# Patient Record
Sex: Female | Born: 1950 | Race: White | Hispanic: No | Marital: Single | Smoking: Former smoker
Health system: Southern US, Community
[De-identification: ages and names within clinical notes are randomized; demographics above are authoritative.]

## PROBLEM LIST (undated history)

## (undated) DIAGNOSIS — E079 Disorder of thyroid, unspecified: Secondary | ICD-10-CM

## (undated) DIAGNOSIS — F32A Depression, unspecified: Secondary | ICD-10-CM

## (undated) DIAGNOSIS — F329 Major depressive disorder, single episode, unspecified: Secondary | ICD-10-CM

## (undated) HISTORY — PX: BREAST BIOPSY: SHX20

## (undated) HISTORY — DX: Major depressive disorder, single episode, unspecified: F32.9

## (undated) HISTORY — DX: Disorder of thyroid, unspecified: E07.9

## (undated) HISTORY — DX: Depression, unspecified: F32.A

## (undated) HISTORY — PX: ABDOMINAL HYSTERECTOMY: SHX81

---

## 2002-12-26 ENCOUNTER — Encounter: Admission: RE | Admit: 2002-12-26 | Discharge: 2003-01-16 | Payer: Self-pay | Admitting: Internal Medicine

## 2003-12-02 ENCOUNTER — Ambulatory Visit (HOSPITAL_COMMUNITY): Admission: RE | Admit: 2003-12-02 | Discharge: 2003-12-02 | Payer: Self-pay | Admitting: Internal Medicine

## 2003-12-30 ENCOUNTER — Ambulatory Visit (HOSPITAL_COMMUNITY): Admission: RE | Admit: 2003-12-30 | Discharge: 2003-12-30 | Payer: Self-pay | Admitting: Internal Medicine

## 2004-10-19 IMAGING — CT CT HEAD W/O CM
1 of 2 series · 13 of 30 positions shown, 17 images · non-contrast
Comparison: none

[Series 2: brain · axial · 0.47mm/px · z∈[+147,+261]mm · 13 of 32 slices shown, 17 images]
[im 3/32  brain]
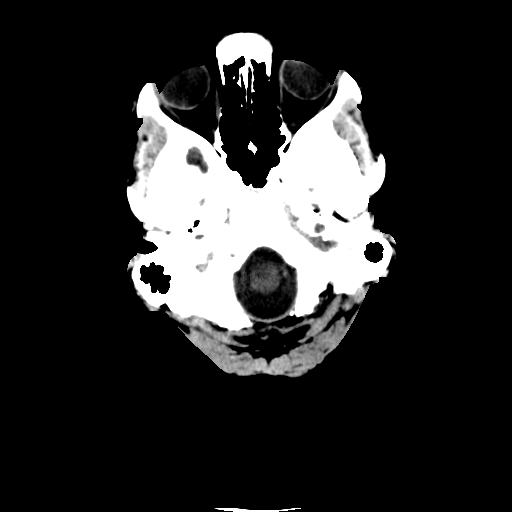
[im 3/32  bone]
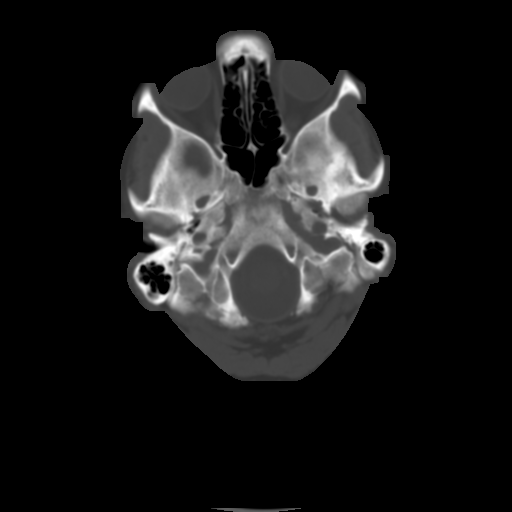
[im 5/32  brain]
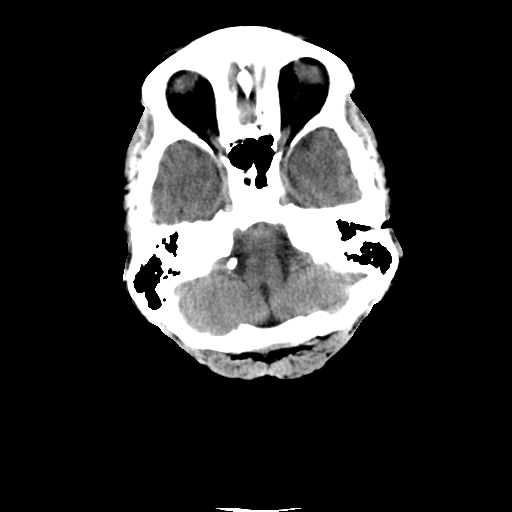
[im 7/32  brain]
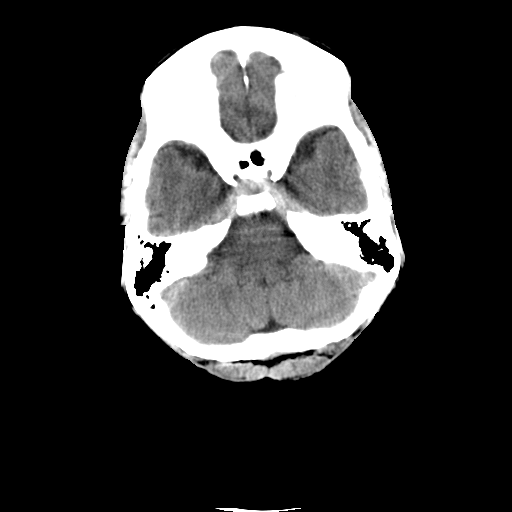
[im 9/32  brain]
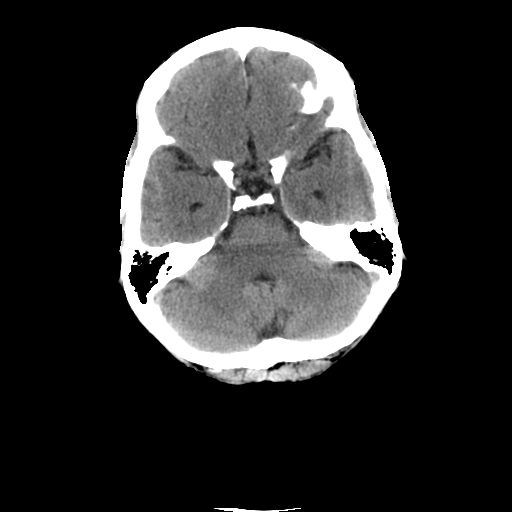
[im 12/32  brain]
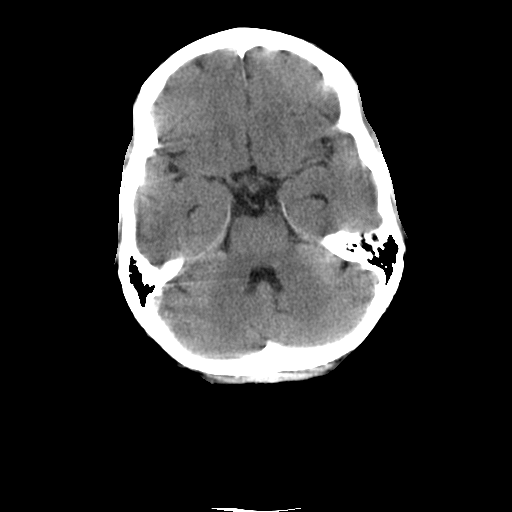
[im 12/32  bone]
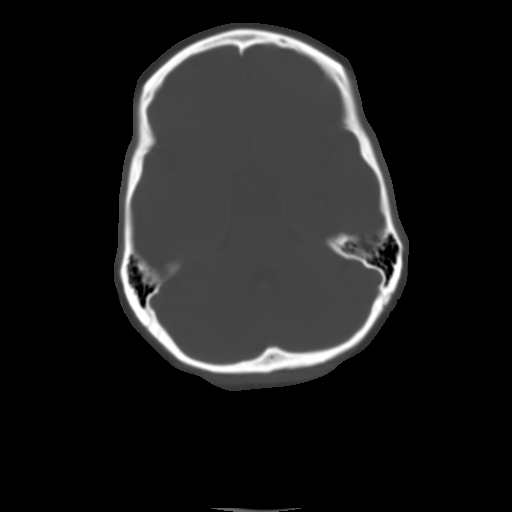
[im 14/32  brain]
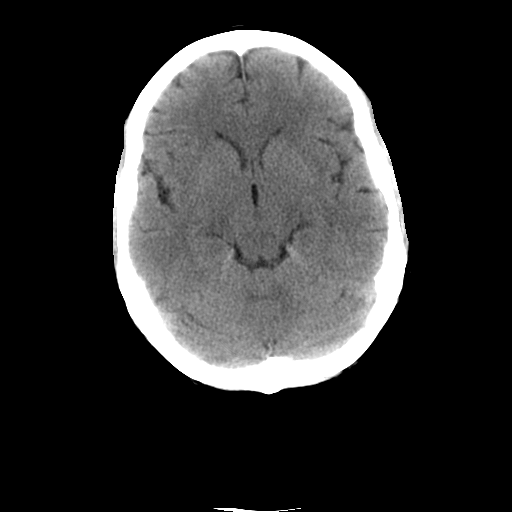
[im 16/32  brain]
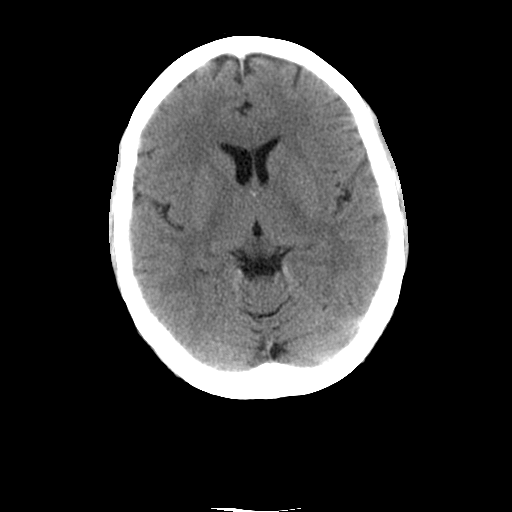
[im 18/32  brain]
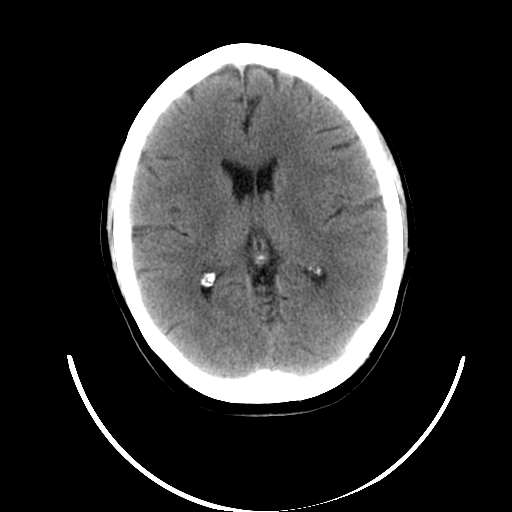
[im 20/32  brain]
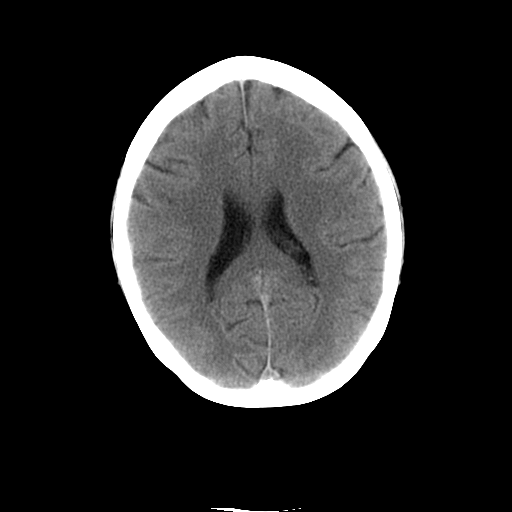
[im 20/32  bone]
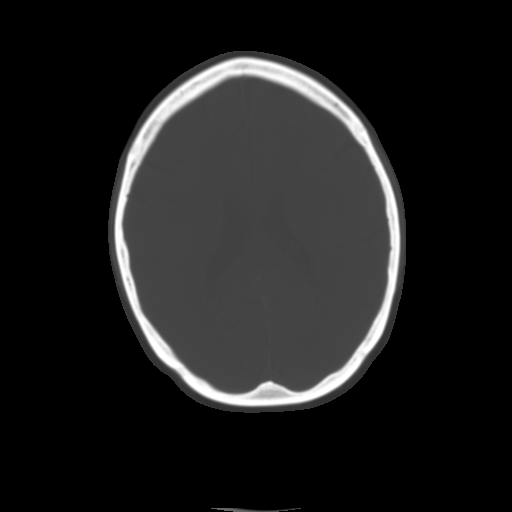
[im 23/32  brain]
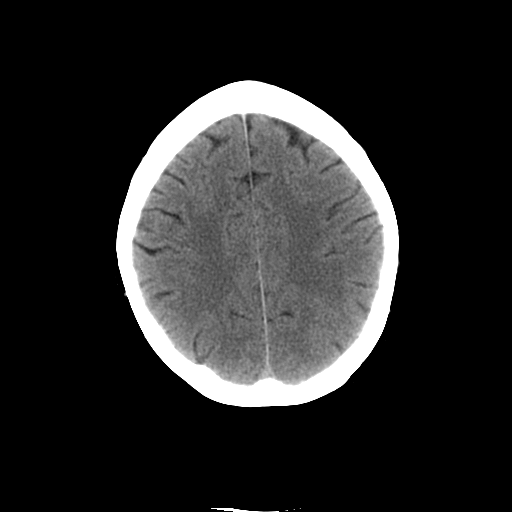
[im 25/32  brain]
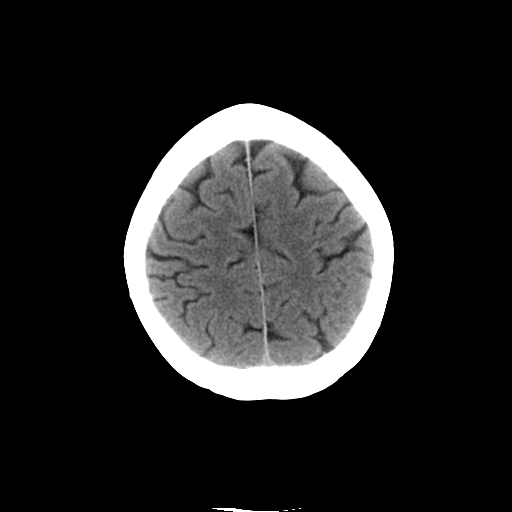
[im 27/32  brain]
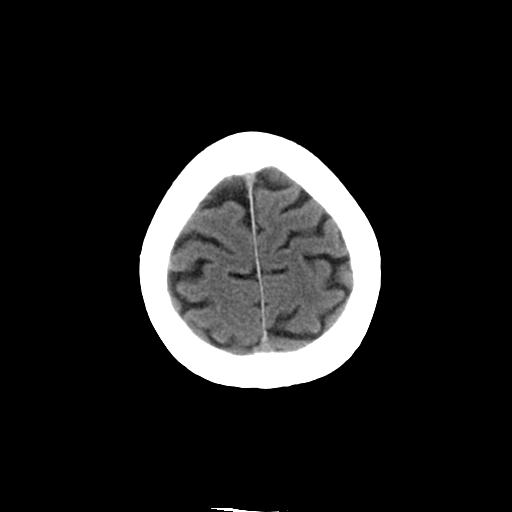
[im 29/32  brain]
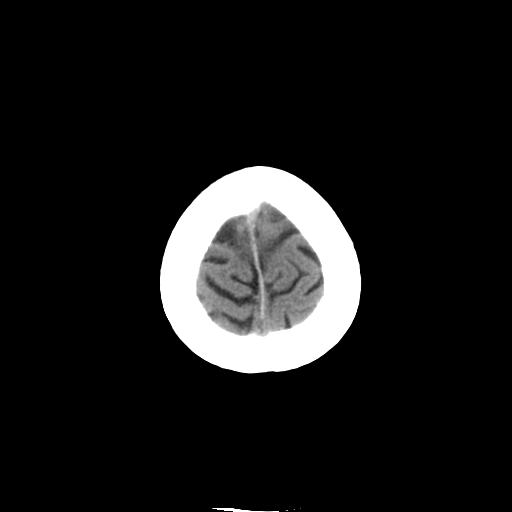
[im 29/32  bone]
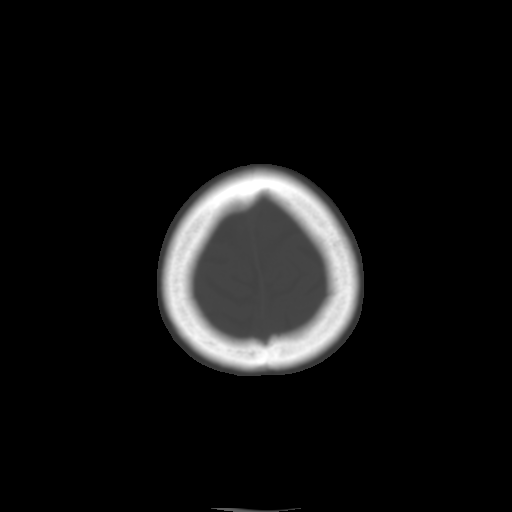

[13 of 30 positions shown; findings below may reference images not displayed]

CT HEAD WITHOUT CONTRAST:  12/30/03
 CLINICAL DATA
 Vertigo, nausea, ataxia.
 TECHNIQUE
 Multiple contiguous axial images obtained from the skull base to the vertex.
 FINDINGS
 No comparison films available.
 No evidence of intracranial abnormality including mass or mass effect, hydrocephalus, extraaxial fluid collection, midline shift, hemorrhage, or infarct.  Acute infarct may be missed by CT for 24-48 hours.  Visualized bony calvarium and paranasal sinuses are unremarkable.
 IMPRESSION
 Unremarkable CT of the head without contrast.

## 2005-03-15 ENCOUNTER — Ambulatory Visit: Payer: Self-pay | Admitting: Internal Medicine

## 2007-08-23 ENCOUNTER — Encounter: Admission: RE | Admit: 2007-08-23 | Discharge: 2007-08-23 | Payer: Self-pay | Admitting: Internal Medicine

## 2008-06-12 IMAGING — CR DG CHEST 2V
2 series · 2 of 2 positions shown · non-contrast
Comparison: none

CLINICAL DATA: Cough, chest pain. 
 CHEST X-RAY:

[w chest lat]
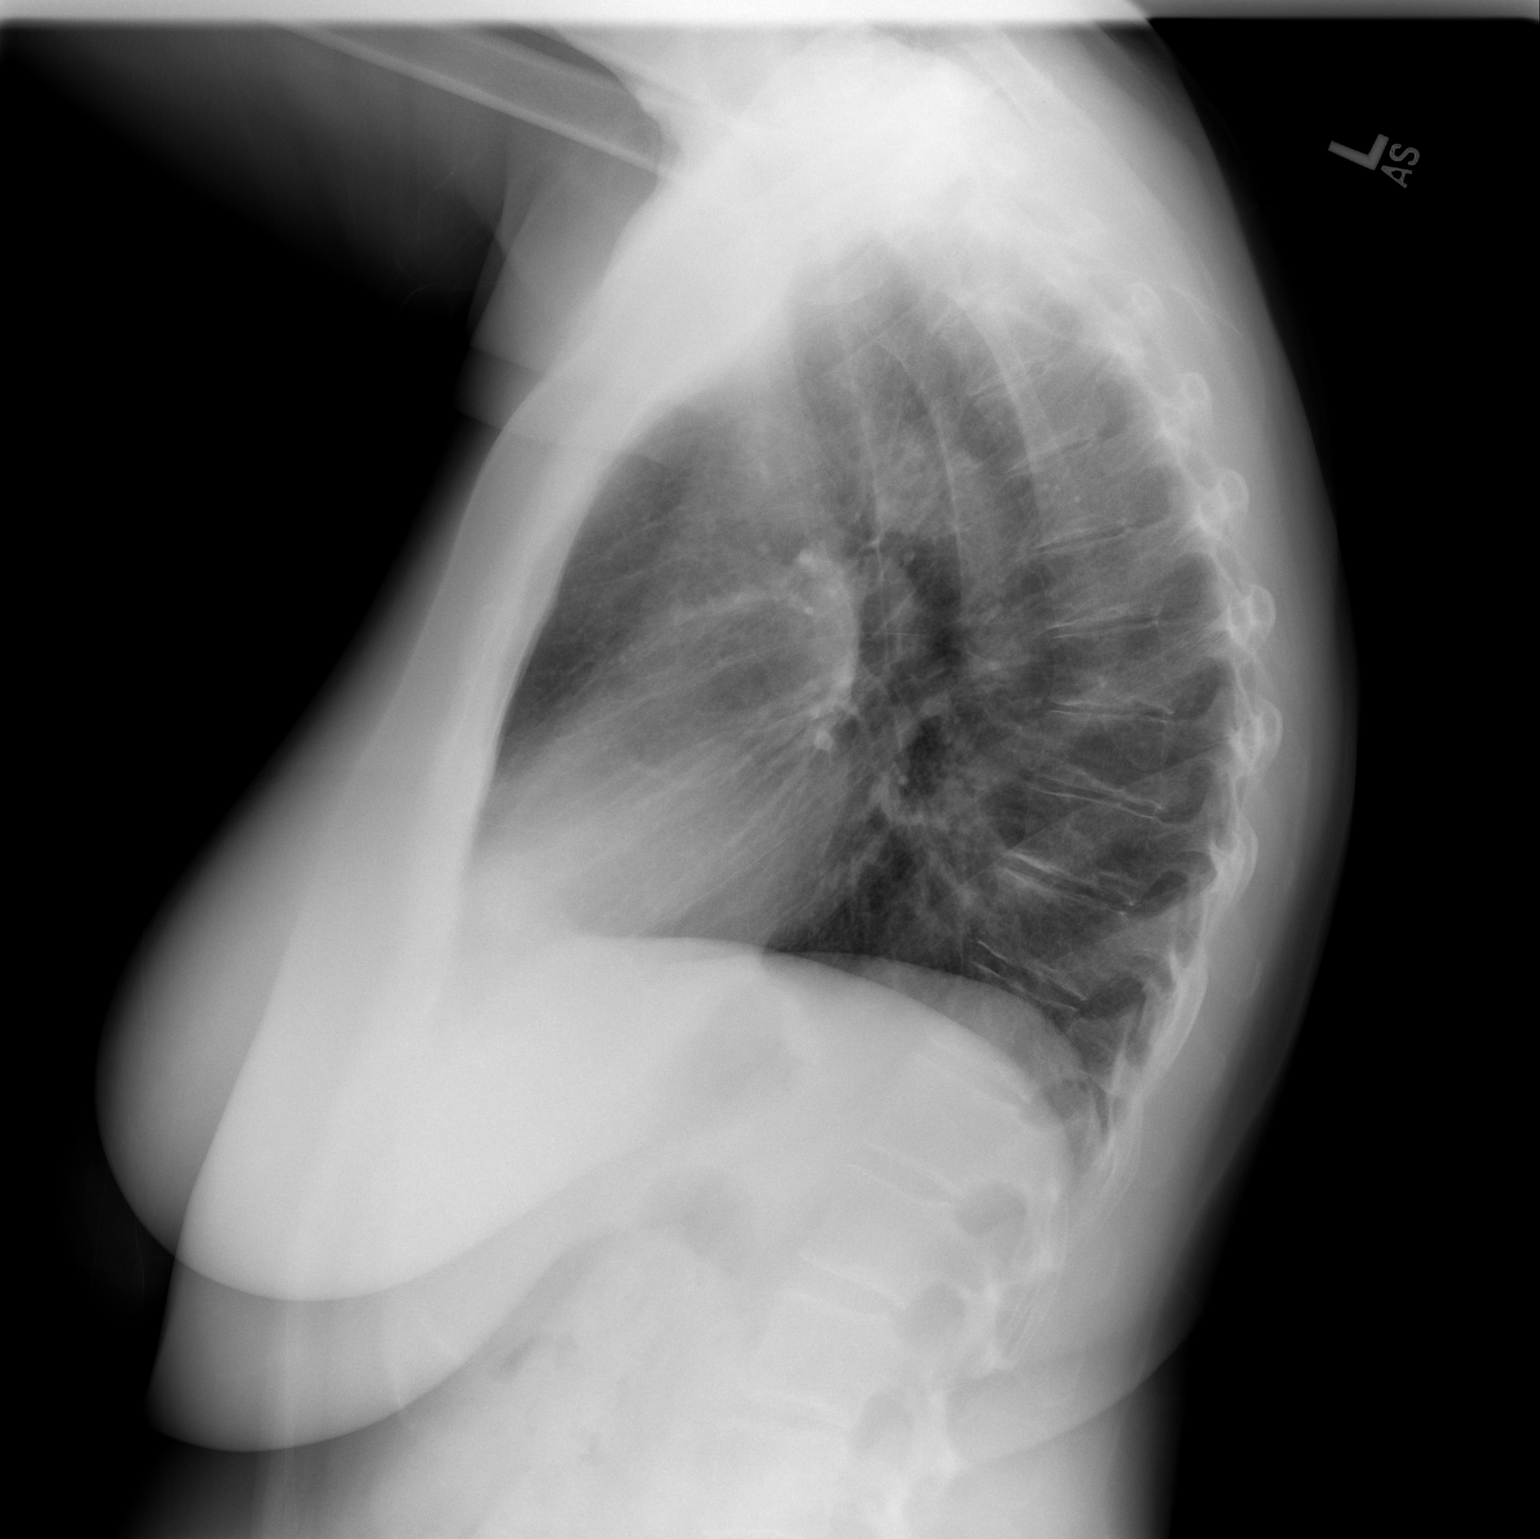

[w chest pa]
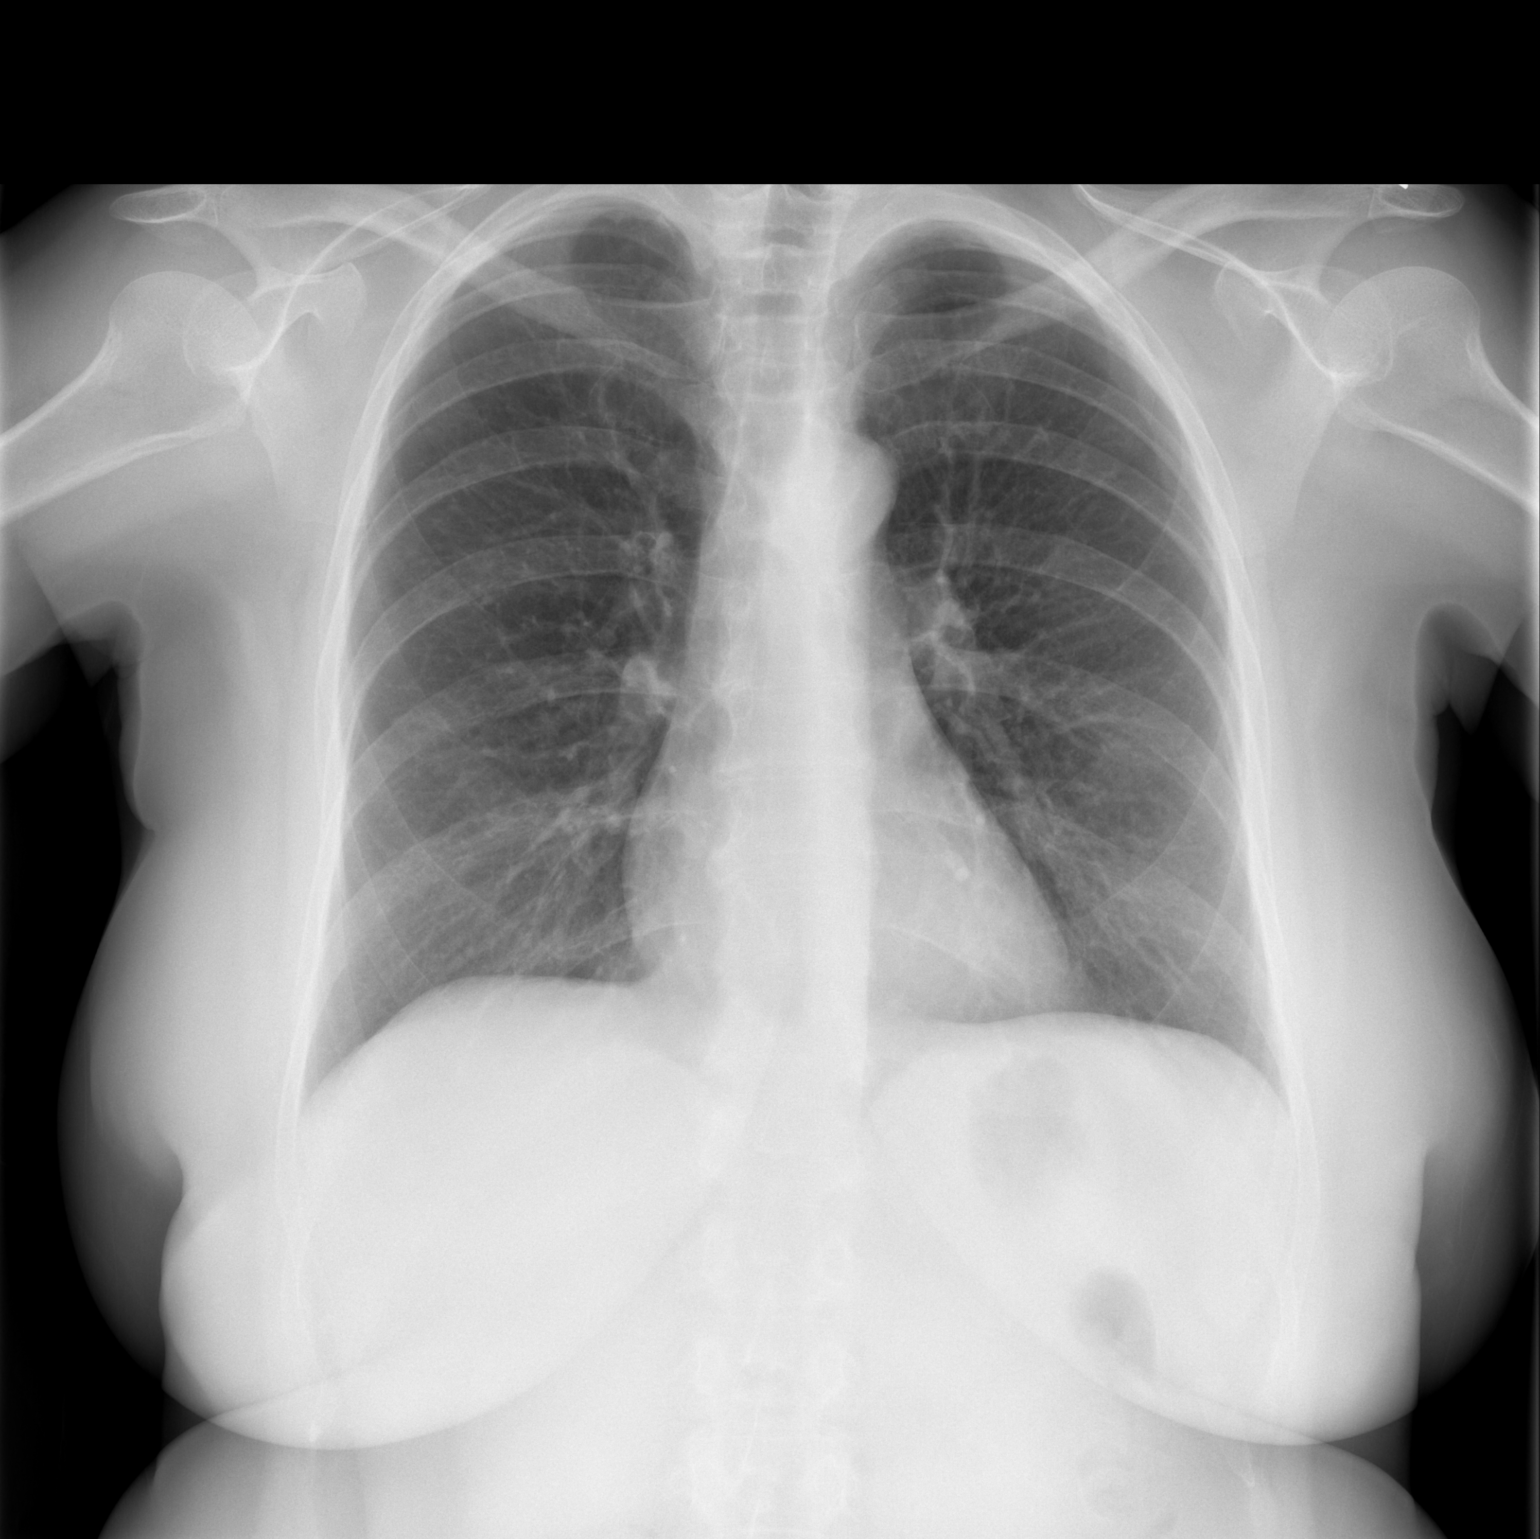

[2 of 2 positions shown; findings below may reference images not displayed]

FINDINGS: Two views of the chest show the lungs to be clear.  The heart is within normal limits in size.  No bony abnormality is seen.
IMPRESSION: No active lung disease.

## 2013-10-15 DIAGNOSIS — M353 Polymyalgia rheumatica: Secondary | ICD-10-CM | POA: Insufficient documentation

## 2013-10-15 DIAGNOSIS — L719 Rosacea, unspecified: Secondary | ICD-10-CM | POA: Insufficient documentation

## 2014-02-13 DIAGNOSIS — M810 Age-related osteoporosis without current pathological fracture: Secondary | ICD-10-CM | POA: Insufficient documentation

## 2014-02-13 DIAGNOSIS — M8580 Other specified disorders of bone density and structure, unspecified site: Secondary | ICD-10-CM | POA: Insufficient documentation

## 2015-08-12 ENCOUNTER — Ambulatory Visit (INDEPENDENT_AMBULATORY_CARE_PROVIDER_SITE_OTHER): Payer: PPO | Admitting: Internal Medicine

## 2015-08-12 ENCOUNTER — Encounter: Payer: Self-pay | Admitting: Internal Medicine

## 2015-08-12 VITALS — BP 130/70 | HR 75 | Temp 97.8°F | Resp 16 | Wt 157.0 lb

## 2015-08-12 DIAGNOSIS — M722 Plantar fascial fibromatosis: Secondary | ICD-10-CM | POA: Diagnosis not present

## 2015-08-12 DIAGNOSIS — L719 Rosacea, unspecified: Secondary | ICD-10-CM | POA: Diagnosis not present

## 2015-08-12 DIAGNOSIS — M8580 Other specified disorders of bone density and structure, unspecified site: Secondary | ICD-10-CM

## 2015-08-12 DIAGNOSIS — M353 Polymyalgia rheumatica: Secondary | ICD-10-CM | POA: Diagnosis not present

## 2015-08-12 NOTE — Progress Notes (Signed)
Pre visit review using our clinic review tool, if applicable. No additional management support is needed unless otherwise documented below in the visit note. 

## 2015-08-12 NOTE — Patient Instructions (Addendum)
  We have reviewed your prior records including labs and tests today.  Test(s) ordered today. Your results will be released to [facility] (or called to you) after review, usually within 72hours after test completion. If any changes need to be made, you will be notified at that same time.  Medications reviewed and updated.  No changes recommended at this time.  Scheduled a wellness visit for sometime - ideally you should be seen once a year.

## 2015-08-12 NOTE — Assessment & Plan Note (Signed)
Was on prednisone for one year No current symptoms

## 2015-08-12 NOTE — Progress Notes (Signed)
Subjective:    Patient ID: Gail Alvarez, female    DOB: [birth year], 65 y.o.   MRN: [id number]  HPI She is here to establish with a new pcp.  She has no complaints.    Rosacea:  She is using the metronidazole cream.  Plantar fasciitis, right foot:  She has done some exercises, but has not done them consistently.  She is currently not exercising regularly. She plans on starting to swim soon hopefully and may do some classes at the gym.  PMR:  She was on prednisone for one year.  She denies muscle pain and weakness. She feels the PMR was triggered by emotional stress.    Medications and allergies reviewed with patient and updated.  Patient Active Problem List   Diagnosis Date Noted  . Plantar fasciitis of right foot 08/12/2015  . Osteopenia 02/13/2014  . Polymyalgia rheumatica ([place]) 10/15/2013  . Acne erythematosa 10/15/2013    No current outpatient prescriptions on file prior to visit.   No current facility-administered medications on file prior to visit.    Past Medical History  Diagnosis Date  . Depression   . Thyroid disease     Past Surgical History  Procedure Laterality Date  . Breast biopsy  [phone]  . Abdominal hysterectomy      ovaries left    Social History   Social History  . Marital Status: Single    Spouse Name: N/A  . Number of Children: N/A  . Years of Education: N/A   Social History Main Topics  . Smoking status: Former Research scientist (life sciences)  . Smokeless tobacco: Never Used  . Alcohol Use: Yes  . Drug Use: No  . Sexual Activity: Not Asked   Other Topics Concern  . None   Social History Narrative    Family History  Problem Relation Age of Onset  . Cancer Mother     Cervical & pancreatic  . Hyperlipidemia Mother   . Alcohol abuse Maternal Grandmother   . Arthritis Maternal Grandmother   . Heart disease Maternal Grandmother   . Alcohol abuse Maternal Grandfather   . Heart disease Maternal Grandfather   . Arthritis Paternal Grandmother     . Alzheimer's disease Father     Review of Systems  Constitutional: Negative for fever and chills.  HENT: Positive for hearing loss (has had in evalauted, minor if anything) and postnasal drip.   Eyes: Negative for visual disturbance.  Respiratory: Negative for cough, shortness of breath and wheezing.   Cardiovascular: Positive for palpitations (sometimes). Negative for chest pain and leg swelling.  Gastrointestinal: Negative for abdominal pain, diarrhea, constipation and blood in stool.  Genitourinary: Negative for dysuria and hematuria.  Musculoskeletal: Negative for myalgias and back pain.       Right plantar fasciitis  Neurological: Negative for dizziness, weakness, light-headedness, numbness and headaches.       Objective:   Filed Vitals:   08/12/15 1300  BP: 130/70  Pulse: 75  Temp: 97.8 F (36.6 C)  Resp: 16   Filed Weights   08/12/15 1300  Weight: 157 lb (71.215 kg)   There is no height on file to calculate BMI.   Physical Exam Constitutional: She appears well-developed and well-nourished. No distress.  HENT:  Head: Normocephalic and atraumatic.  Right Ear: External ear normal. Normal ear canal and TM Left Ear: External ear normal.  Normal ear canal and TM Mouth/Throat: Oropharynx is clear and moist.  Normal bilateral ear canals and tympanic membranes  Eyes: Conjunctivae  and EOM are normal.  Neck: Neck supple. No tracheal deviation present. No thyromegaly present.  No carotid bruit  Cardiovascular: Normal rate, regular rhythm and normal heart sounds.   No murmur heard.  No edema. Pulmonary/Chest: Effort normal and breath sounds normal. No respiratory distress. She has no wheezes. She has no rales.  Abdominal: Soft. She exhibits no distension. There is no tenderness.  Lymphadenopathy: She has no cervical adenopathy.  Skin: Skin is warm and dry. She is not diaphoretic.  Psychiatric: She has a normal mood and affect. Her behavior is normal.        Assessment & Plan:   See Problem List for Assessment and Plan of chronic medical problems.  She will schedule an eye exam and a mammogram.   Has had a colonoscopy - polyp removed - copy made of her report  Follow-up annually for physical exam/wellness

## 2015-08-12 NOTE — Assessment & Plan Note (Addendum)
Taking vitamin D Active, not currently exercising - plans on starting silver sneakers

## 2015-08-12 NOTE — Assessment & Plan Note (Signed)
Using metronidazole cream.

## 2015-08-12 NOTE — Assessment & Plan Note (Addendum)
Doing the stretches-encouraged her to do them regularly

## 2015-08-29 ENCOUNTER — Other Ambulatory Visit: Payer: Self-pay

## 2015-08-29 DIAGNOSIS — Z1231 Encounter for screening mammogram for malignant neoplasm of breast: Secondary | ICD-10-CM

## 2015-09-18 ENCOUNTER — Ambulatory Visit
Admission: RE | Admit: 2015-09-18 | Discharge: 2015-09-18 | Disposition: A | Discharge status: Home / Self Care | Payer: PPO | Source: Ambulatory Visit

## 2015-09-18 DIAGNOSIS — Z1231 Encounter for screening mammogram for malignant neoplasm of breast: Secondary | ICD-10-CM

## 2015-10-22 DIAGNOSIS — H5213 Myopia, bilateral: Secondary | ICD-10-CM | POA: Diagnosis not present

## 2015-10-28 ENCOUNTER — Encounter: Payer: PPO | Admitting: Internal Medicine

## 2016-02-09 DIAGNOSIS — L820 Inflamed seborrheic keratosis: Secondary | ICD-10-CM | POA: Diagnosis not present

## 2016-02-09 DIAGNOSIS — D485 Neoplasm of uncertain behavior of skin: Secondary | ICD-10-CM | POA: Diagnosis not present

## 2016-02-09 DIAGNOSIS — L821 Other seborrheic keratosis: Secondary | ICD-10-CM | POA: Diagnosis not present

## 2016-03-30 ENCOUNTER — Encounter: Payer: Self-pay | Admitting: Internal Medicine

## 2016-03-30 ENCOUNTER — Ambulatory Visit (INDEPENDENT_AMBULATORY_CARE_PROVIDER_SITE_OTHER): Payer: PPO | Admitting: Internal Medicine

## 2016-03-30 VITALS — BP 124/84 | HR 82 | Temp 98.0°F | Resp 16 | Ht 66.0 in | Wt 164.0 lb

## 2016-03-30 DIAGNOSIS — Z13820 Encounter for screening for osteoporosis: Secondary | ICD-10-CM | POA: Diagnosis not present

## 2016-03-30 DIAGNOSIS — Z0000 Encounter for general adult medical examination without abnormal findings: Secondary | ICD-10-CM | POA: Diagnosis not present

## 2016-03-30 DIAGNOSIS — M8580 Other specified disorders of bone density and structure, unspecified site: Secondary | ICD-10-CM

## 2016-03-30 DIAGNOSIS — Z1159 Encounter for screening for other viral diseases: Secondary | ICD-10-CM | POA: Diagnosis not present

## 2016-03-30 NOTE — Assessment & Plan Note (Signed)
Taking vitamin d and calcium daily No regular exercise Recheck dexa, last 2015

## 2016-03-30 NOTE — Patient Instructions (Addendum)
Test(s) ordered today. Your results will be released to [facility] (or called to you) after review, usually within 72hours after test completion. If any changes need to be made, you will be notified at that same time.  All other Health Maintenance issues reviewed.   All recommended immunizations and age-appropriate screenings are up-to-date or discussed.  No immunizations administered today.   Medications reviewed and updated.  No changes recommended at this time.   Please followup in one year for a physical exam  Health Maintenance, Female Adopting a healthy lifestyle and getting preventive care can go a long way to promote health and wellness. Talk with your health care provider about what schedule of regular examinations is right for you. This is a good chance for you to check in with your provider about disease prevention and staying healthy. In between checkups, there are plenty of things you can do on your own. Experts have done a lot of research about which lifestyle changes and preventive measures are most likely to keep you healthy. Ask your health care provider for more information. WEIGHT AND DIET  Eat a healthy diet  Be sure to include plenty of vegetables, fruits, low-fat dairy products, and lean protein.  Do not eat a lot of foods high in solid fats, added sugars, or salt.  Get regular exercise. This is one of the most important things you can do for your health.  Most adults should exercise for at least 150 minutes each week. The exercise should increase your heart rate and make you sweat (moderate-intensity exercise).  Most adults should also do strengthening exercises at least twice a week. This is in addition to the moderate-intensity exercise.  Maintain a healthy weight  Body mass index (BMI) is a measurement that can be used to identify possible weight problems. It estimates body fat based on height and weight. Your health care provider can help determine your BMI and  help you achieve or maintain a healthy weight.  For females 62 years of age and older:   A BMI below 18.5 is considered underweight.  A BMI of 18.5 to 24.9 is normal.  A BMI of 25 to 29.9 is considered overweight.  A BMI of 30 and above is considered obese.  Watch levels of cholesterol and blood lipids  You should start having your blood tested for lipids and cholesterol at 67 years of age, then have this test every 5 years.  You may need to have your cholesterol levels checked more often if:  Your lipid or cholesterol levels are high.  You are older than 67 years of age.  You are at high risk for heart disease.  CANCER SCREENING   Lung Cancer  Lung cancer screening is recommended for adults 27-87 years old who are at high risk for lung cancer because of a history of smoking.  A yearly low-dose CT scan of the lungs is recommended for people who:  Currently smoke.  Have quit within the past 15 years.  Have at least a 30-pack-year history of smoking. A pack year is smoking an average of one pack of cigarettes a day for 1 year.  Yearly screening should continue until it has been 15 years since you quit.  Yearly screening should stop if you develop a health problem that would prevent you from having lung cancer treatment.  Breast Cancer  Practice breast self-awareness. This means understanding how your breasts normally appear and feel.  It also means doing regular breast self-exams. Let your  health care provider know about any changes, no matter how small.  If you are in your 20s or 30s, you should have a clinical breast exam (CBE) by a health care provider every 1-3 years as part of a regular health exam.  If you are 104 or older, have a CBE every year. Also consider having a breast X-ray (mammogram) every year.  If you have a family history of breast cancer, talk to your health care provider about genetic screening.  If you are at high risk for breast cancer, talk  to your health care provider about having an MRI and a mammogram every year.  Breast cancer gene (BRCA) assessment is recommended for women who have family members with BRCA-related cancers. BRCA-related cancers include:  Breast.  Ovarian.  Tubal.  Peritoneal cancers.  Results of the assessment will determine the need for genetic counseling and BRCA1 and BRCA2 testing. Cervical Cancer Your health care provider may recommend that you be screened regularly for cancer of the pelvic organs (ovaries, uterus, and vagina). This screening involves a pelvic examination, including checking for microscopic changes to the surface of your cervix (Pap test). You may be encouraged to have this screening done every 3 years, beginning at age 42.  For women ages 97-65, health care providers may recommend pelvic exams and Pap testing every 3 years, or they may recommend the Pap and pelvic exam, combined with testing for human papilloma virus (HPV), every 5 years. Some types of HPV increase your risk of cervical cancer. Testing for HPV may also be done on women of any age with unclear Pap test results.  Other health care providers may not recommend any screening for nonpregnant women who are considered low risk for pelvic cancer and who do not have symptoms. Ask your health care provider if a screening pelvic exam is right for you.  If you have had past treatment for cervical cancer or a condition that could lead to cancer, you need Pap tests and screening for cancer for at least 20 years after your treatment. If Pap tests have been discontinued, your risk factors (such as having a new sexual partner) need to be reassessed to determine if screening should resume. Some women have medical problems that increase the chance of getting cervical cancer. In these cases, your health care provider may recommend more frequent screening and Pap tests. Colorectal Cancer  This type of cancer can be detected and often  prevented.  Routine colorectal cancer screening usually begins at 67 years of age and continues through 67 years of age.  Your health care provider may recommend screening at an earlier age if you have risk factors for colon cancer.  Your health care provider may also recommend using home test kits to check for hidden blood in the stool.  A small camera at the end of a tube can be used to examine your colon directly (sigmoidoscopy or colonoscopy). This is done to check for the earliest forms of colorectal cancer.  Routine screening usually begins at age 51.  Direct examination of the colon should be repeated every 5-10 years through 67 years of age. However, you may need to be screened more often if early forms of precancerous polyps or small growths are found. Skin Cancer  Check your skin from head to toe regularly.  Tell your health care provider about any new moles or changes in moles, especially if there is a change in a mole's shape or color.  Also tell your health  care provider if you have a mole that is larger than the size of a pencil eraser.  Always use sunscreen. Apply sunscreen liberally and repeatedly throughout the day.  Protect yourself by wearing long sleeves, pants, a wide-brimmed hat, and sunglasses whenever you are outside. HEART DISEASE, DIABETES, AND HIGH BLOOD PRESSURE   High blood pressure causes heart disease and increases the risk of stroke. High blood pressure is more likely to develop in:  People who have blood pressure in the high end of the normal range (130-139/85-89 mm Hg).  People who are overweight or obese.  People who are African American.  If you are 109-50 years of age, have your blood pressure checked every 3-5 years. If you are 22 years of age or older, have your blood pressure checked every year. You should have your blood pressure measured twice--once when you are at a hospital or clinic, and once when you are not at a hospital or clinic.  Record the average of the two measurements. To check your blood pressure when you are not at a hospital or clinic, you can use:  An automated blood pressure machine at a pharmacy.  A home blood pressure monitor.  If you are between 75 years and 79 years old, ask your health care provider if you should take aspirin to prevent strokes.  Have regular diabetes screenings. This involves taking a blood sample to check your fasting blood sugar level.  If you are at a normal weight and have a low risk for diabetes, have this test once every three years after 67 years of age.  If you are overweight and have a high risk for diabetes, consider being tested at a younger age or more often. PREVENTING INFECTION  Hepatitis B  If you have a higher risk for hepatitis B, you should be screened for this virus. You are considered at high risk for hepatitis B if:  You were born in a country where hepatitis B is common. Ask your health care provider which countries are considered high risk.  Your parents were born in a high-risk country, and you have not been immunized against hepatitis B (hepatitis B vaccine).  You have HIV or AIDS.  You use needles to inject street drugs.  You live with someone who has hepatitis B.  You have had sex with someone who has hepatitis B.  You get hemodialysis treatment.  You take certain medicines for conditions, including cancer, organ transplantation, and autoimmune conditions. Hepatitis C  Blood testing is recommended for:  Everyone born from [birth year] through 1967.  Anyone with known risk factors for hepatitis C. Sexually transmitted infections (STIs)  You should be screened for sexually transmitted infections (STIs) including gonorrhea and chlamydia if:  You are sexually active and are younger than 67 years of age.  You are older than 67 years of age and your health care provider tells you that you are at risk for this type of infection.  Your sexual activity  has changed since you were last screened and you are at an increased risk for chlamydia or gonorrhea. Ask your health care provider if you are at risk.  If you do not have HIV, but are at risk, it may be recommended that you take a prescription medicine daily to prevent HIV infection. This is called pre-exposure prophylaxis (PrEP). You are considered at risk if:  You are sexually active and do not regularly use condoms or know the HIV status of your partner(s).  You take  drugs by injection.  You are sexually active with a partner who has HIV. Talk with your health care provider about whether you are at high risk of being infected with HIV. If you choose to begin PrEP, you should first be tested for HIV. You should then be tested every 3 months for as long as you are taking PrEP.  PREGNANCY   If you are premenopausal and you may become pregnant, ask your health care provider about preconception counseling.  If you may become pregnant, take 400 to 800 micrograms (mcg) of folic acid every day.  If you want to prevent pregnancy, talk to your health care provider about birth control (contraception). OSTEOPOROSIS AND MENOPAUSE   Osteoporosis is a disease in which the bones lose minerals and strength with aging. This can result in serious bone fractures. Your risk for osteoporosis can be identified using a bone density scan.  If you are 59 years of age or older, or if you are at risk for osteoporosis and fractures, ask your health care provider if you should be screened.  Ask your health care provider whether you should take a calcium or vitamin D supplement to lower your risk for osteoporosis.  Menopause may have certain physical symptoms and risks.  Hormone replacement therapy may reduce some of these symptoms and risks. Talk to your health care provider about whether hormone replacement therapy is right for you.  HOME CARE INSTRUCTIONS   Schedule regular health, dental, and eye  exams.  Stay current with your immunizations.   Do not use any tobacco products including cigarettes, chewing tobacco, or electronic cigarettes.  If you are pregnant, do not drink alcohol.  If you are breastfeeding, limit how much and how often you drink alcohol.  Limit alcohol intake to no more than 1 drink per day for nonpregnant women. One drink equals 12 ounces of beer, 5 ounces of wine, or 1 ounces of hard liquor.  Do not use street drugs.  Do not share needles.  Ask your health care provider for help if you need support or information about quitting drugs.  Tell your health care provider if you often feel depressed.  Tell your health care provider if you have ever been abused or do not feel safe at home.   This information is not intended to replace advice given to you by your health care provider. Make sure you discuss any questions you have with your health care provider.   Document Released: 02/01/2011 Document Revised: 08/09/2014 Document Reviewed: 06/20/2013 Elsevier Interactive Patient Education [employer].

## 2016-03-30 NOTE — Progress Notes (Signed)
Subjective:    Patient ID: Gail Alvarez, female    DOB: 14-Oct-1950, 65 y.o.   MRN: [id number]  HPI She is here for a physical exam.   She denies any changes in her history.  She has no concerns.   Her large breasts do bother her.  She has chronic upper back pain related to the weight of her breasts.  Overall, it makes her tired.  She would like to consider a breast reduction.    Medications and allergies reviewed with patient and updated if appropriate.  Patient Active Problem List   Diagnosis Date Noted  . Plantar fasciitis of right foot 08/12/2015  . Osteopenia 02/13/2014  . Polymyalgia rheumatica ([place]) 10/15/2013  . Acne erythematosa 10/15/2013    Current Outpatient Prescriptions on File Prior to Visit  Medication Sig Dispense Refill  . B Complex-Biotin-FA (B COMPLETE PO) Take by mouth daily.    . Cholecalciferol (VITAMIN D-3) 1000 units CAPS Take 5,000 Units by mouth daily.     . Magnesium 200 MG TABS Take by mouth daily.    . metronidazole (NORITATE) 1 % cream Apply topically daily.    . TURMERIC PO Take by mouth daily.    Marland Kitchen UNABLE TO FIND daily. Alive Calcium, K2, Mag, D3, Buron, & Strontion     No current facility-administered medications on file prior to visit.     Past Medical History:  Diagnosis Date  . Depression   . Thyroid disease     Past Surgical History:  Procedure Laterality Date  . ABDOMINAL HYSTERECTOMY     ovaries left  . BREAST BIOPSY  [phone]    Social History   Social History  . Marital status: Single    Spouse name: N/A  . Number of children: N/A  . Years of education: N/A   Social History Main Topics  . Smoking status: Former Research scientist (life sciences)  . Smokeless tobacco: Never Used  . Alcohol use Yes  . Drug use: No  . Sexual activity: Not Asked   Other Topics Concern  . None   Social History Narrative   Exercise:  No regular exercise    Family History  Problem Relation Age of Onset  . Cancer Mother     Cervical & pancreatic    . Hyperlipidemia Mother   . Alzheimer's disease Father   . Alcohol abuse Maternal Grandmother   . Arthritis Maternal Grandmother   . Heart disease Maternal Grandmother   . Alcohol abuse Maternal Grandfather   . Heart disease Maternal Grandfather   . Arthritis Paternal Grandmother     Review of Systems  Constitutional: Negative for appetite change, chills, fatigue, fever and unexpected weight change.  HENT: Positive for sneezing.   Eyes: Negative for visual disturbance.  Respiratory: Positive for cough (? allergy related). Negative for shortness of breath and wheezing.   Cardiovascular: Positive for palpitations (rare). Negative for chest pain and leg swelling.  Gastrointestinal: Negative for abdominal pain, blood in stool, constipation, diarrhea and nausea.       No gerd  Genitourinary: Negative for dysuria and hematuria.  Musculoskeletal: Positive for back pain (upper back pain from breasts). Negative for arthralgias.  Skin: Negative for color change and rash.  Neurological: Negative for dizziness, light-headedness and headaches.  Psychiatric/Behavioral: Negative for dysphoric mood. The patient is not nervous/anxious.        Objective:   Vitals:   03/30/16 1259  BP: 124/84  Pulse: 82  Resp: 16  Temp: 98 F (36.7 C)  Filed Weights   03/30/16 1259  Weight: 164 lb (74.4 kg)   Body mass index is 26.47 kg/m.   Physical Exam Constitutional: She appears well-developed and well-nourished. No distress.  HENT:  Head: Normocephalic and atraumatic.  Right Ear: External ear normal. Normal ear canal and TM Left Ear: External ear normal.  Normal ear canal and TM Mouth/Throat: Oropharynx is clear and moist.  Eyes: Conjunctivae and EOM are normal.  Neck: Neck supple. No tracheal deviation present. No thyromegaly present.  No carotid bruit  Cardiovascular: Normal rate, regular rhythm and normal heart sounds.   No murmur heard.  No edema. Pulmonary/Chest: Effort normal and  breath sounds normal. No respiratory distress. She has no wheezes. She has no rales.  Breast: normal b/l breast exam with no lumps, skin changes, nipple discharge Abdominal: Soft. She exhibits no distension. There is no tenderness.  Lymphadenopathy: She has no cervical adenopathy.  No axillary lymphadenopathy.  Skin: Skin is warm and dry. She is not diaphoretic.  Psychiatric: She has a normal mood and affect. Her behavior is normal.       Assessment & Plan:   Physical exam: Screening blood work ordered Immunizations deferred all vaccines Colonoscopy  Up to date  Mammogram  Up to date  61 - no longer seeing a gyn Dexa  Last done 2015 -- will reorder given history of osteopenia, stressed regular exercise Eye exams  Up to date  Exercise - not exercising regularly, stressed regular exercise Weight -   BMI above normal Skin - no concerns, has seen derm recently Substance abuse  none  See Problem List for Assessment and Plan of chronic medical problems.  Follow up annually

## 2016-03-30 NOTE — Progress Notes (Signed)
Pre visit review using our clinic review tool, if applicable. No additional management support is needed unless otherwise documented below in the visit note. 

## 2016-03-31 ENCOUNTER — Telehealth: Payer: Self-pay

## 2016-03-31 NOTE — Telephone Encounter (Signed)
Patient called in saying that her bp on her a/s is wrong. Her bp was 124/74 not 124/84. And she wants it fixed , changed and taken off. I am not sure what we need to do about this. But I am sending you a message. ??

## 2016-03-31 NOTE — Telephone Encounter (Signed)
Spoke with pt to inform that this is not something that we can go back and change.

## 2016-04-23 ENCOUNTER — Other Ambulatory Visit (INDEPENDENT_AMBULATORY_CARE_PROVIDER_SITE_OTHER): Payer: PPO

## 2016-04-23 DIAGNOSIS — Z0000 Encounter for general adult medical examination without abnormal findings: Secondary | ICD-10-CM | POA: Diagnosis not present

## 2016-04-23 DIAGNOSIS — Z1159 Encounter for screening for other viral diseases: Secondary | ICD-10-CM | POA: Diagnosis not present

## 2016-04-23 DIAGNOSIS — R7989 Other specified abnormal findings of blood chemistry: Secondary | ICD-10-CM

## 2016-04-23 LAB — COMPREHENSIVE METABOLIC PANEL
ALK PHOS: 69 U/L (ref 39–117)
ALT: 18 U/L (ref 0–35)
AST: 18 U/L (ref 0–37)
Albumin: 4 g/dL (ref 3.5–5.2)
BILIRUBIN TOTAL: 0.5 mg/dL (ref 0.2–1.2)
BUN: 15 mg/dL (ref 6–23)
CALCIUM: 9.4 mg/dL (ref 8.4–10.5)
CO2: 27 mEq/L (ref 19–32)
Chloride: 106 mEq/L (ref 96–112)
Creatinine, Ser: 0.86 mg/dL (ref 0.40–1.20)
GFR: 69.84 mL/min (ref >60.00)
GLUCOSE: 92 mg/dL (ref 70–99)
Potassium: 4.2 mEq/L (ref 3.5–5.1)
Sodium: 141 mEq/L (ref 135–145)
TOTAL PROTEIN: 7.3 g/dL (ref 6.0–8.3)

## 2016-04-23 LAB — LIPID PANEL
Cholesterol: 198 mg/dL (ref 0–200)
HDL: 34.6 mg/dL — AB (ref >39.00)
NonHDL: 163.74
TRIGLYCERIDES: 221 mg/dL — AB (ref 0.0–149.0)
Total CHOL/HDL Ratio: 6
VLDL: 44.2 mg/dL — ABNORMAL HIGH (ref 0.0–40.0)

## 2016-04-23 LAB — CBC WITH DIFFERENTIAL/PLATELET
BASOS ABS: 0 10*3/uL (ref 0.0–0.1)
Basophils Relative: 0.4 % (ref 0.0–3.0)
Eosinophils Absolute: 0.3 10*3/uL (ref 0.0–0.7)
Eosinophils Relative: 4.5 % (ref 0.0–5.0)
HEMATOCRIT: 40.9 % (ref 36.0–46.0)
Hemoglobin: 14 g/dL (ref 12.0–15.0)
LYMPHS PCT: 41.7 % (ref 12.0–46.0)
Lymphs Abs: 2.7 10*3/uL (ref 0.7–4.0)
MCHC: 34.2 g/dL (ref 30.0–36.0)
MCV: 87.8 fl (ref 78.0–100.0)
MONOS PCT: 6.8 % (ref 3.0–12.0)
Monocytes Absolute: 0.4 10*3/uL (ref 0.1–1.0)
Neutro Abs: 3 10*3/uL (ref 1.4–7.7)
Neutrophils Relative %: 46.6 % (ref 43.0–77.0)
Platelets: 284 10*3/uL (ref 150.0–400.0)
RBC: 4.66 Mil/uL (ref 3.87–5.11)
RDW: 13.1 % (ref 11.5–15.5)
WBC: 6.4 10*3/uL (ref 4.0–10.5)

## 2016-04-23 LAB — TSH: TSH: 2.05 u[IU]/mL (ref 0.35–4.50)

## 2016-04-23 LAB — LDL CHOLESTEROL, DIRECT: LDL DIRECT: 112 mg/dL

## 2016-04-24 LAB — HEPATITIS C ANTIBODY: HCV Ab: NEGATIVE

## 2016-04-26 ENCOUNTER — Encounter: Payer: Self-pay | Admitting: Emergency Medicine

## 2016-05-18 ENCOUNTER — Ambulatory Visit (INDEPENDENT_AMBULATORY_CARE_PROVIDER_SITE_OTHER)
Admission: RE | Admit: 2016-05-18 | Discharge: 2016-05-18 | Disposition: A | Discharge status: Home / Self Care | Payer: PPO | Source: Ambulatory Visit | Attending: Internal Medicine | Admitting: Internal Medicine

## 2016-05-18 DIAGNOSIS — M8580 Other specified disorders of bone density and structure, unspecified site: Secondary | ICD-10-CM

## 2016-05-18 DIAGNOSIS — M85861 Other specified disorders of bone density and structure, right lower leg: Secondary | ICD-10-CM

## 2016-05-18 DIAGNOSIS — Z13820 Encounter for screening for osteoporosis: Secondary | ICD-10-CM

## 2016-05-24 ENCOUNTER — Encounter: Payer: Self-pay | Admitting: Emergency Medicine

## 2016-08-16 ENCOUNTER — Telehealth: Payer: Self-pay | Admitting: Internal Medicine

## 2016-08-16 NOTE — Telephone Encounter (Signed)
Called patient to schedule awv appt. Left msg for patient to call office to schedule appt.

## 2016-11-23 ENCOUNTER — Telehealth: Payer: Self-pay | Admitting: Internal Medicine

## 2016-11-23 NOTE — Telephone Encounter (Signed)
Called patient to schedule awv. Lvm for patient to call office to schedule appt.  °

## 2017-03-08 ENCOUNTER — Encounter: Payer: Self-pay | Admitting: Internal Medicine

## 2017-03-08 ENCOUNTER — Ambulatory Visit (INDEPENDENT_AMBULATORY_CARE_PROVIDER_SITE_OTHER): Payer: PPO | Admitting: Internal Medicine

## 2017-03-08 ENCOUNTER — Other Ambulatory Visit (INDEPENDENT_AMBULATORY_CARE_PROVIDER_SITE_OTHER): Payer: PPO

## 2017-03-08 ENCOUNTER — Other Ambulatory Visit: Payer: Self-pay | Admitting: Internal Medicine

## 2017-03-08 VITALS — BP 122/74 | HR 71 | Temp 97.8°F | Resp 16 | Wt 158.0 lb

## 2017-03-08 DIAGNOSIS — R5383 Other fatigue: Secondary | ICD-10-CM

## 2017-03-08 DIAGNOSIS — M2550 Pain in unspecified joint: Secondary | ICD-10-CM

## 2017-03-08 LAB — CBC WITH DIFFERENTIAL/PLATELET
BASOS ABS: 0.1 10*3/uL (ref 0.0–0.1)
BASOS PCT: 1.2 % (ref 0.0–3.0)
EOS ABS: 0.3 10*3/uL (ref 0.0–0.7)
Eosinophils Relative: 3.5 % (ref 0.0–5.0)
HEMATOCRIT: 42.5 % (ref 36.0–46.0)
HEMOGLOBIN: 14.3 g/dL (ref 12.0–15.0)
LYMPHS PCT: 36.6 % (ref 12.0–46.0)
Lymphs Abs: 2.8 10*3/uL (ref 0.7–4.0)
MCHC: 33.7 g/dL (ref 30.0–36.0)
MCV: 90.3 fl (ref 78.0–100.0)
Monocytes Absolute: 0.6 10*3/uL (ref 0.1–1.0)
Monocytes Relative: 7.2 % (ref 3.0–12.0)
Neutro Abs: 3.9 10*3/uL (ref 1.4–7.7)
Neutrophils Relative %: 51.5 % (ref 43.0–77.0)
Platelets: 270 10*3/uL (ref 150.0–400.0)
RBC: 4.7 Mil/uL (ref 3.87–5.11)
RDW: 13.1 % (ref 11.5–15.5)
WBC: 7.7 10*3/uL (ref 4.0–10.5)

## 2017-03-08 LAB — COMPREHENSIVE METABOLIC PANEL
ALBUMIN: 4.4 g/dL (ref 3.5–5.2)
ALK PHOS: 64 U/L (ref 39–117)
ALT: 14 U/L (ref 0–35)
AST: 17 U/L (ref 0–37)
BILIRUBIN TOTAL: 0.4 mg/dL (ref 0.2–1.2)
BUN: 14 mg/dL (ref 6–23)
CALCIUM: 10.3 mg/dL (ref 8.4–10.5)
CHLORIDE: 103 meq/L (ref 96–112)
CO2: 29 mEq/L (ref 19–32)
CREATININE: 0.84 mg/dL (ref 0.40–1.20)
GFR: 71.57 mL/min (ref >60.00)
Glucose, Bld: 100 mg/dL — ABNORMAL HIGH (ref 70–99)
Potassium: 3.9 mEq/L (ref 3.5–5.1)
SODIUM: 139 meq/L (ref 135–145)
Total Protein: 7.5 g/dL (ref 6.0–8.3)

## 2017-03-08 NOTE — Patient Instructions (Addendum)
Test(s) ordered today. Your results will be released to [place] (or called to you) after review, usually within 72hours after test completion. If any changes need to be made, you will be notified at that same time.     Tick Bite Information, Adult Ticks are insects that draw blood for food. Most ticks live in shrubs and grassy areas. They climb onto people and animals that brush against the leaves and grasses that they rest on. Then they bite, attaching themselves to the skin. Most ticks are harmless, but some ticks carry germs that can spread to a person through a bite and cause a disease. To reduce your risk of getting a disease from a tick bite, it is important to take steps to prevent tick bites. It is also important to check for ticks after being outdoors. If you find that a tick has attached to you, watch for symptoms of disease. How can I prevent tick bites? Take these steps to help prevent tick bites when you are outdoors in an area where ticks are found:  Use insect repellent that has DEET (20% or higher), picaridin, or IR3535 in it. Use it on: ? Skin that is showing. ? The top of your boots. ? Your pant legs. ? Your sleeve cuffs.  For repellent products that contain permethrin, follow product instructions. Use these products on: ? Clothing. ? Gear. ? Boots. ? Tents.  Wear protective clothing. Long sleeves and long pants offer the best protection from ticks.  Wear light-colored clothing so you can see ticks more easily.  Tuck your pant legs into your socks.  If you go walking on a trail, stay in the middle of the trail so your skin, hair, and clothing do not touch the bushes.  Avoid walking through areas with long grass.  Check for ticks on your clothing, hair, and skin often while you are outside, and check again before you go inside. Make sure to check the places that ticks attach themselves most often. These places include the scalp, neck, armpits, waist, groin, and  joint areas. Ticks that carry a disease called Lyme disease have to be attached to the skin for 24-48 hours. Checking for ticks every day will lessen your risk of this and other diseases.  When you come indoors, wash your clothes and take a shower or a bath right away. Dry your clothes in a dryer on high heat for at least 60 minutes. This will kill any ticks in your clothes.  What is the proper way to remove a tick? If you find a tick on your body, remove it as soon as possible. Removing a tick sooner rather than later can prevent germs from passing from the tick to your body. To remove a tick that is crawling on your skin but has not bitten:  Go outdoors and brush the tick off.  Remove the tick with tape or a lint roller.  To remove a tick that is attached to your skin:  Wash your hands.  If you have latex gloves, put them on.  Use tweezers, curved forceps, or a tick-removal tool to gently grasp the tick as close to your skin and the tick's head as possible.  Gently pull with steady, upward pressure until the tick lets go. When removing the tick: ? Take care to keep the tick's head attached to its body. ? Do not twist or jerk the tick. This can make the tick's head or mouth break off. ? Do not squeeze or  crush the tick's body. This could force disease-carrying fluids from the tick into your body.  Do not try to remove a tick with heat, alcohol, petroleum jelly, or fingernail polish. Using these methods can cause the tick to salivate and regurgitate into your bloodstream, increasing your risk of getting a disease. What should I do after removing a tick?  Clean the bite area with soap and water, rubbing alcohol, or an iodine scrub.  If an antiseptic cream or ointment is available, apply a small amount to the bite site.  Wash and disinfect any instruments that you used to remove the tick. How should I dispose of a tick? To dispose of a live tick, use one of these methods:  Place it  in rubbing alcohol.  Place it in a sealed bag or container.  Wrap it tightly in tape.  Flush it down the toilet.  Contact a health care provider if:  You have symptoms of a disease after a tick bite. Symptoms of a tick-borne disease can occur from moments after the tick bites to up to 30 days after a tick is removed. Symptoms include: ? Muscle, joint, or bone pain. ? Difficulty walking or moving your legs. ? Numbness in the legs. ? Paralysis. ? Red rash around the tick bite area that is shaped like a target or a "bull's-eye." ? Redness and swelling in the area of the tick bite. ? Fever. ? Repeated vomiting. ? Diarrhea. ? Weight loss. ? Tender, swollen lymph glands. ? Shortness of breath. ? Cough. ? Pain in the abdomen. ? Headache. ? Abnormal tiredness. ? A change in your level of consciousness. ? Confusion. Get help right away if:  You are not able to remove a tick.  A part of a tick breaks off and gets stuck in your skin.  Your symptoms get worse. Summary  Ticks may carry germs that can spread to a person through a bite and cause disease.  Wear protective clothing and use insect repellent to prevent tick bites. Follow product instructions.  If you find a tick on your body, remove it as soon as possible. If the tick is attached, do not try to remove with heat, alcohol, petroleum jelly, or fingernail polish.  Remove the attached tick using tweezers, curved forceps, or a tick-removal tool. Gently pull with steady, upward pressure until the tick lets go. Do not twist or jerk the tick. Do not squeeze or crush the tick's body.  If you have symptoms after being bitten by a tick, contact a health care provider. This information is not intended to replace advice given to you by your health care provider. Make sure you discuss any questions you have with your health care provider. Document Released: 07/16/2000 Document Revised: 04/30/2016 Document Reviewed: 04/30/2016 Elsevier  Interactive Patient Education  [employer].

## 2017-03-08 NOTE — Assessment & Plan Note (Signed)
Not signficant Will check cbc, cmp, lyme Doubt lyme disease Discussed lyme dz symptoms and prevention Return it symptoms do not improve

## 2017-03-08 NOTE — Progress Notes (Signed)
Subjective:    Patient ID: Gail Alvarez, female    DOB: May 24, 1951, 66 y.o.   MRN: [id number]  HPI She is here for an acute visit for a possible tick bite.   She thinks she had a tick bite about 4 weeks ago.  She was boarding a dog and it was always on her couch.  She felt a pinch on her left upper leg when she was sitting on the couch.  A day or so later she scratched and felt a bump.  It was a little dot.  She used tweezers and was able to get it off.  There was still something on her but it came off.  She is not positive, but thinks it was a tick - it was very small.  She has seen other ticks.   She denies any rashes or target lesion. She was concerned about lyme disease.   She feels fatigued.  Her memory does not seem as good - she could not remember the word tick the other day.  She has some intermittent back and joint pain, which is not new.  She is unsure if it is worse.  She denies rash.  She denies fever.    Medications and allergies reviewed with patient and updated if appropriate.  Patient Active Problem List   Diagnosis Date Noted  . Plantar fasciitis of right foot 08/12/2015  . Osteopenia 02/13/2014  . Polymyalgia rheumatica ([place]) 10/15/2013  . Acne erythematosa 10/15/2013    Current Outpatient Prescriptions on File Prior to Visit  Medication Sig Dispense Refill  . B Complex-Biotin-FA (B COMPLETE PO) Take by mouth daily.    . Cholecalciferol (VITAMIN D-3) 1000 units CAPS Take 5,000 Units by mouth daily.     . Magnesium 200 MG TABS Take by mouth daily.    . metronidazole (NORITATE) 1 % cream Apply topically daily.    . TURMERIC PO Take by mouth daily.    Marland Kitchen UNABLE TO FIND daily. Alive Calcium, K2, Mag, D3, Buron, & Strontion     No current facility-administered medications on file prior to visit.     Past Medical History:  Diagnosis Date  . Depression   . Thyroid disease     Past Surgical History:  Procedure Laterality Date  . ABDOMINAL HYSTERECTOMY     ovaries left  . BREAST BIOPSY  [phone]    Social History   Social History  . Marital status: Single    Spouse name: N/A  . Number of children: N/A  . Years of education: N/A   Social History Main Topics  . Smoking status: Former Research scientist (life sciences)  . Smokeless tobacco: Never Used  . Alcohol use Yes     Comment: social  . Drug use: No  . Sexual activity: Not Asked   Other Topics Concern  . None   Social History Narrative   Exercise:  No regular exercise    Family History  Problem Relation Age of Onset  . Cancer Mother        Cervical & pancreatic  . Hyperlipidemia Mother   . Alzheimer's disease Father   . Alcohol abuse Maternal Grandmother   . Arthritis Maternal Grandmother   . Heart disease Maternal Grandmother   . Alcohol abuse Maternal Grandfather   . Heart disease Maternal Grandfather   . Arthritis Paternal Grandmother     Review of Systems  Constitutional: Positive for fatigue. Negative for chills and fever.  Respiratory: Negative for cough, shortness of breath and  wheezing.   Cardiovascular: Negative for chest pain and palpitations.  Gastrointestinal: Negative for abdominal pain, diarrhea and nausea.  Musculoskeletal: Positive for arthralgias (more than usual) and back pain (chronic, worse - no pain now, at night). Negative for joint swelling and myalgias.  Skin: Negative for rash.  Neurological: Positive for light-headedness (only when she stands up, occasional). Negative for dizziness and headaches (saturday only).  Psychiatric/Behavioral:       Change in memory       Objective:   Vitals:   03/08/17 1633  BP: 122/74  Pulse: 71  Resp: 16  Temp: 97.8 F (36.6 C)   Filed Weights   03/08/17 1633  Weight: 158 lb (71.7 kg)   Body mass index is 25.5 kg/m.  Wt Readings from Last 3 Encounters:  03/08/17 158 lb (71.7 kg)  03/30/16 164 lb (74.4 kg)  08/12/15 157 lb (71.2 kg)     Physical Exam  Constitutional: Appears well-developed and  well-nourished. No distress.  HENT:  Head: Normocephalic and atraumatic.  Neck: Neck supple. No tracheal deviation present. No thyromegaly present.  No cervical lymphadenopathy Cardiovascular: Normal rate, regular rhythm and normal heart sounds.   No murmur heard. No carotid bruit .  No edema Pulmonary/Chest: Effort normal and breath sounds normal. No respiratory distress. No has no wheezes. No rales. MSK:  No joint deformities, joint pain or swelling  Skin: Skin is warm and dry. Not diaphoretic.  no rashes Psychiatric: Normal mood and affect. Behavior is normal.         Assessment & Plan:   See Problem List for Assessment and Plan of chronic medical problems.

## 2017-03-08 NOTE — Assessment & Plan Note (Signed)
Has intermittent joint pain - ? Increased No joint swelling Doubt lyme disease, but will rule it out Return is symptoms worsen for further evaluation

## 2017-03-09 LAB — LYME AB/WESTERN BLOT REFLEX: B burgdorferi Ab IgG+IgM: <0.9 Index (ref <0.90)

## 2017-10-14 ENCOUNTER — Encounter: Payer: Self-pay | Admitting: Internal Medicine

## 2017-10-14 DIAGNOSIS — Z1231 Encounter for screening mammogram for malignant neoplasm of breast: Secondary | ICD-10-CM | POA: Diagnosis not present

## 2017-10-14 DIAGNOSIS — M8589 Other specified disorders of bone density and structure, multiple sites: Secondary | ICD-10-CM | POA: Diagnosis not present

## 2017-10-14 LAB — HM MAMMOGRAPHY

## 2017-10-17 LAB — HM DEXA SCAN

## 2017-10-19 ENCOUNTER — Telehealth: Payer: Self-pay | Admitting: Internal Medicine

## 2017-10-19 NOTE — Telephone Encounter (Signed)
dexa results  Your bone density scan showed that you have osteopenia, which is some thinning of your bones. This does increase your risk of a fracture.   To maintain or improve your bone density you should exercise regularly and make sure you are getting about 1000-2000 units of vitamin D daily. Ideally you should be getting approximately 1200 mg of calcium daily in a combination of food and supplements.   You should have another bone density scan in two years to reevaluate.

## 2017-10-20 ENCOUNTER — Encounter: Payer: Self-pay | Admitting: Emergency Medicine

## 2017-10-20 NOTE — Telephone Encounter (Signed)
Tried contacting pt, unable to LVM, no ring. Mailing results.

## 2017-10-23 NOTE — Progress Notes (Signed)
Subjective:    Patient ID: Gail Alvarez, female    DOB: 1951/02/10, 67 y.o.   MRN: [id number]  HPI She is here for a physical exam.   She has no cocerns.    Medications and allergies reviewed with patient and updated if appropriate.  Patient Active Problem List   Diagnosis Date Noted  . Fatigue 03/08/2017  . Arthralgia 03/08/2017  . Plantar fasciitis of right foot 08/12/2015  . Osteopenia 02/13/2014  . Polymyalgia rheumatica ([place]) 10/15/2013  . Acne erythematosa 10/15/2013    Current Outpatient Medications on File Prior to Visit  Medication Sig Dispense Refill  . B Complex-Biotin-FA (B COMPLETE PO) Take by mouth daily.    . Cholecalciferol (VITAMIN D-3) 1000 units CAPS Take 5,000 Units by mouth daily.     . Magnesium 200 MG TABS Take by mouth daily.    . TURMERIC PO Take by mouth daily.    Marland Kitchen UNABLE TO FIND daily. Alive Calcium, K2, Mag, D3, Buron, & Strontion     No current facility-administered medications on file prior to visit.     Past Medical History:  Diagnosis Date  . Depression   . Thyroid disease     Past Surgical History:  Procedure Laterality Date  . ABDOMINAL HYSTERECTOMY     ovaries left  . BREAST BIOPSY  [phone]    Social History   Socioeconomic History  . Marital status: Single    Spouse name: Not on file  . Number of children: Not on file  . Years of education: Not on file  . Highest education level: Not on file  Occupational History  . Not on file  Social Needs  . Financial resource strain: Not on file  . Food insecurity:    Worry: Not on file    Inability: Not on file  . Transportation needs:    Medical: Not on file    Non-medical: Not on file  Tobacco Use  . Smoking status: Former Research scientist (life sciences)  . Smokeless tobacco: Never Used  Substance and Sexual Activity  . Alcohol use: Yes    Comment: social  . Drug use: No  . Sexual activity: Not on file  Lifestyle  . Physical activity:    Days per week: Not on file    Minutes per  session: Not on file  . Stress: Not on file  Relationships  . Social connections:    Talks on phone: Not on file    Gets together: Not on file    Attends religious service: Not on file    Active member of club or organization: Not on file    Attends meetings of clubs or organizations: Not on file    Relationship status: Not on file  Other Topics Concern  . Not on file  Social History Narrative   Exercise:  No regular exercise    Family History  Problem Relation Age of Onset  . Cancer Mother        Cervical & pancreatic  . Hyperlipidemia Mother   . Alzheimer's disease Father   . Alcohol abuse Maternal Grandmother   . Arthritis Maternal Grandmother   . Heart disease Maternal Grandmother   . Alcohol abuse Maternal Grandfather   . Heart disease Maternal Grandfather   . Arthritis Paternal Grandmother     Review of Systems  Constitutional: Negative for chills and fever.  HENT: Negative for congestion and sinus pressure.   Eyes: Negative for visual disturbance.  Respiratory: Negative for cough, shortness of  breath and wheezing.   Cardiovascular: Negative for chest pain, palpitations and leg swelling.  Gastrointestinal: Negative for abdominal pain, blood in stool, constipation, diarrhea and nausea.  Genitourinary: Negative for dysuria, frequency and hematuria.  Musculoskeletal: Positive for back pain (lower back with repetedly bending). Negative for arthralgias and myalgias.  Skin: Negative for color change and rash.  Neurological: Positive for dizziness (rare). Negative for light-headedness and headaches.  Psychiatric/Behavioral: Negative for dysphoric mood. The patient is not nervous/anxious.        Objective:   Vitals:   10/24/17 1406  BP: 114/72  Pulse: 74  Resp: 16  Temp: 97.7 F (36.5 C)  SpO2: 98%   Filed Weights   10/24/17 1406  Weight: 161 lb (73 kg)   Body mass index is 25.99 kg/m.  Wt Readings from Last 3 Encounters:  10/24/17 161 lb (73 kg)    03/08/17 158 lb (71.7 kg)  03/30/16 164 lb (74.4 kg)     Physical Exam Constitutional: She appears well-developed and well-nourished. No distress.  HENT:  Head: Normocephalic and atraumatic.  Right Ear: External ear normal. Normal ear canal and TM Left Ear: External ear normal.  Normal ear canal and TM Mouth/Throat: Oropharynx is clear and moist.  Eyes: Conjunctivae and EOM are normal.  Neck: Neck supple. No tracheal deviation present. No thyromegaly present.  No carotid bruit  Cardiovascular: Normal rate, regular rhythm and normal heart sounds.   No murmur heard.  No edema. Pulmonary/Chest: Effort normal and breath sounds normal. No respiratory distress. She has no wheezes. She has no rales.  Breast: deferred Abdominal: Soft. She exhibits no distension. There is no tenderness.  Lymphadenopathy: She has no cervical adenopathy.  Skin: Skin is warm and dry. She is not diaphoretic.  Psychiatric: She has a normal mood and affect. Her behavior is normal.        Assessment & Plan:   Physical exam: Screening blood work  ordered [place], td and flu due,  Discussed shingles Colonoscopy   Up to date  Mammogram    Up to date  Gyn  No longer following Dexa    Up to date  - done this month - osteopenia Eye exams  Up to date  Exercise  Walking for exercise 2-3 times a day - 1 mile Weight  Advised weight loss Skin   No concerns Substance abuse    none  See Problem List for Assessment and Plan of chronic medical problems.    FU in 1 year

## 2017-10-23 NOTE — Patient Instructions (Addendum)
Test(s) ordered today. Your results will be released to [facility] (or called to you) after review, usually within 72hours after test completion. If any changes need to be made, you will be notified at that same time.  All other Health Maintenance issues reviewed.   All recommended immunizations and age-appropriate screenings are up-to-date or discussed.  No immunizations administered today.   Medications reviewed and updated.  No changes recommended at this time.  Your prescription(s) have been submitted to your pharmacy. Please take as directed and contact our office if you believe you are having problem(s) with the medication(s).   Please followup in one year   Health Maintenance, Female Adopting a healthy lifestyle and getting preventive care can go a long way to promote health and wellness. Talk with your health care provider about what schedule of regular examinations is right for you. This is a good chance for you to check in with your provider about disease prevention and staying healthy. In between checkups, there are plenty of things you can do on your own. Experts have done a lot of research about which lifestyle changes and preventive measures are most likely to keep you healthy. Ask your health care provider for more information. Weight and diet Eat a healthy diet  Be sure to include plenty of vegetables, fruits, low-fat dairy products, and lean protein.  Do not eat a lot of foods high in solid fats, added sugars, or salt.  Get regular exercise. This is one of the most important things you can do for your health. ? Most adults should exercise for at least 150 minutes each week. The exercise should increase your heart rate and make you sweat (moderate-intensity exercise). ? Most adults should also do strengthening exercises at least twice a week. This is in addition to the moderate-intensity exercise.  Maintain a healthy weight  Body mass index (BMI) is a measurement that can  be used to identify possible weight problems. It estimates body fat based on height and weight. Your health care provider can help determine your BMI and help you achieve or maintain a healthy weight.  For females 37 years of age and older: ? A BMI below 18.5 is considered underweight. ? A BMI of 18.5 to 24.9 is normal. ? A BMI of 25 to 29.9 is considered overweight. ? A BMI of 30 and above is considered obese.  Watch levels of cholesterol and blood lipids  You should start having your blood tested for lipids and cholesterol at 69 years of age, then have this test every 5 years.  You may need to have your cholesterol levels checked more often if: ? Your lipid or cholesterol levels are high. ? You are older than 69 years of age. ? You are at high risk for heart disease.  Cancer screening Lung Cancer  Lung cancer screening is recommended for adults 67-73 years old who are at high risk for lung cancer because of a history of smoking.  A yearly low-dose CT scan of the lungs is recommended for people who: ? Currently smoke. ? Have quit within the past 15 years. ? Have at least a 30-pack-year history of smoking. A pack year is smoking an average of one pack of cigarettes a day for 1 year.  Yearly screening should continue until it has been 15 years since you quit.  Yearly screening should stop if you develop a health problem that would prevent you from having lung cancer treatment.  Breast Cancer  Practice breast self-awareness.  This means understanding how your breasts normally appear and feel.  It also means doing regular breast self-exams. Let your health care provider know about any changes, no matter how small.  If you are in your 20s or 30s, you should have a clinical breast exam (CBE) by a health care provider every 1-3 years as part of a regular health exam.  If you are 29 or older, have a CBE every year. Also consider having a breast X-ray (mammogram) every year.  If you  have a family history of breast cancer, talk to your health care provider about genetic screening.  If you are at high risk for breast cancer, talk to your health care provider about having an MRI and a mammogram every year.  Breast cancer gene (BRCA) assessment is recommended for women who have family members with BRCA-related cancers. BRCA-related cancers include: ? Breast. ? Ovarian. ? Tubal. ? Peritoneal cancers.  Results of the assessment will determine the need for genetic counseling and BRCA1 and BRCA2 testing.  Cervical Cancer Your health care provider may recommend that you be screened regularly for cancer of the pelvic organs (ovaries, uterus, and vagina). This screening involves a pelvic examination, including checking for microscopic changes to the surface of your cervix (Pap test). You may be encouraged to have this screening done every 3 years, beginning at age 40.  For women ages 44-65, health care providers may recommend pelvic exams and Pap testing every 3 years, or they may recommend the Pap and pelvic exam, combined with testing for human papilloma virus (HPV), every 5 years. Some types of HPV increase your risk of cervical cancer. Testing for HPV may also be done on women of any age with unclear Pap test results.  Other health care providers may not recommend any screening for nonpregnant women who are considered low risk for pelvic cancer and who do not have symptoms. Ask your health care provider if a screening pelvic exam is right for you.  If you have had past treatment for cervical cancer or a condition that could lead to cancer, you need Pap tests and screening for cancer for at least 20 years after your treatment. If Pap tests have been discontinued, your risk factors (such as having a new sexual partner) need to be reassessed to determine if screening should resume. Some women have medical problems that increase the chance of getting cervical cancer. In these cases,  your health care provider may recommend more frequent screening and Pap tests.  Colorectal Cancer  This type of cancer can be detected and often prevented.  Routine colorectal cancer screening usually begins at 69 years of age and continues through 69 years of age.  Your health care provider may recommend screening at an earlier age if you have risk factors for colon cancer.  Your health care provider may also recommend using home test kits to check for hidden blood in the stool.  A small camera at the end of a tube can be used to examine your colon directly (sigmoidoscopy or colonoscopy). This is done to check for the earliest forms of colorectal cancer.  Routine screening usually begins at age 96.  Direct examination of the colon should be repeated every 5-10 years through 69 years of age. However, you may need to be screened more often if early forms of precancerous polyps or small growths are found.  Skin Cancer  Check your skin from head to toe regularly.  Tell your health care provider about any  new moles or changes in moles, especially if there is a change in a mole's shape or color.  Also tell your health care provider if you have a mole that is larger than the size of a pencil eraser.  Always use sunscreen. Apply sunscreen liberally and repeatedly throughout the day.  Protect yourself by wearing long sleeves, pants, a wide-brimmed hat, and sunglasses whenever you are outside.  Heart disease, diabetes, and high blood pressure  High blood pressure causes heart disease and increases the risk of stroke. High blood pressure is more likely to develop in: ? People who have blood pressure in the high end of the normal range (130-139/85-89 mm Hg). ? People who are overweight or obese. ? People who are African American.  If you are 58-1 years of age, have your blood pressure checked every 3-5 years. If you are 52 years of age or older, have your blood pressure checked every year.  You should have your blood pressure measured twice-once when you are at a hospital or clinic, and once when you are not at a hospital or clinic. Record the average of the two measurements. To check your blood pressure when you are not at a hospital or clinic, you can use: ? An automated blood pressure machine at a pharmacy. ? A home blood pressure monitor.  If you are between 24 years and 65 years old, ask your health care provider if you should take aspirin to prevent strokes.  Have regular diabetes screenings. This involves taking a blood sample to check your fasting blood sugar level. ? If you are at a normal weight and have a low risk for diabetes, have this test once every three years after 69 years of age. ? If you are overweight and have a high risk for diabetes, consider being tested at a younger age or more often. Preventing infection Hepatitis B  If you have a higher risk for hepatitis B, you should be screened for this virus. You are considered at high risk for hepatitis B if: ? You were born in a country where hepatitis B is common. Ask your health care provider which countries are considered high risk. ? Your parents were born in a high-risk country, and you have not been immunized against hepatitis B (hepatitis B vaccine). ? You have HIV or AIDS. ? You use needles to inject street drugs. ? You live with someone who has hepatitis B. ? You have had sex with someone who has hepatitis B. ? You get hemodialysis treatment. ? You take certain medicines for conditions, including cancer, organ transplantation, and autoimmune conditions.  Hepatitis C  Blood testing is recommended for: ? Everyone born from [birth year] through 1967. ? Anyone with known risk factors for hepatitis C.  Sexually transmitted infections (STIs)  You should be screened for sexually transmitted infections (STIs) including gonorrhea and chlamydia if: ? You are sexually active and are younger than 69 years of  age. ? You are older than 69 years of age and your health care provider tells you that you are at risk for this type of infection. ? Your sexual activity has changed since you were last screened and you are at an increased risk for chlamydia or gonorrhea. Ask your health care provider if you are at risk.  If you do not have HIV, but are at risk, it may be recommended that you take a prescription medicine daily to prevent HIV infection. This is called pre-exposure prophylaxis (PrEP). You are considered at  risk if: ? You are sexually active and do not regularly use condoms or know the HIV status of your partner(s). ? You take drugs by injection. ? You are sexually active with a partner who has HIV.  Talk with your health care provider about whether you are at high risk of being infected with HIV. If you choose to begin PrEP, you should first be tested for HIV. You should then be tested every 3 months for as long as you are taking PrEP. Pregnancy  If you are premenopausal and you may become pregnant, ask your health care provider about preconception counseling.  If you may become pregnant, take 400 to 800 micrograms (mcg) of folic acid every day.  If you want to prevent pregnancy, talk to your health care provider about birth control (contraception). Osteoporosis and menopause  Osteoporosis is a disease in which the bones lose minerals and strength with aging. This can result in serious bone fractures. Your risk for osteoporosis can be identified using a bone density scan.  If you are 6 years of age or older, or if you are at risk for osteoporosis and fractures, ask your health care provider if you should be screened.  Ask your health care provider whether you should take a calcium or vitamin D supplement to lower your risk for osteoporosis.  Menopause may have certain physical symptoms and risks.  Hormone replacement therapy may reduce some of these symptoms and risks. Talk to your health  care provider about whether hormone replacement therapy is right for you. Follow these instructions at home:  Schedule regular health, dental, and eye exams.  Stay current with your immunizations.  Do not use any tobacco products including cigarettes, chewing tobacco, or electronic cigarettes.  If you are pregnant, do not drink alcohol.  If you are breastfeeding, limit how much and how often you drink alcohol.  Limit alcohol intake to no more than 1 drink per day for nonpregnant women. One drink equals 12 ounces of beer, 5 ounces of wine, or 1 ounces of hard liquor.  Do not use street drugs.  Do not share needles.  Ask your health care provider for help if you need support or information about quitting drugs.  Tell your health care provider if you often feel depressed.  Tell your health care provider if you have ever been abused or do not feel safe at home. This information is not intended to replace advice given to you by your health care provider. Make sure you discuss any questions you have with your health care provider. Document Released: 02/01/2011 Document Revised: 12/25/2015 Document Reviewed: 04/22/2015 Elsevier Interactive Patient Education  [employer].

## 2017-10-24 ENCOUNTER — Ambulatory Visit (INDEPENDENT_AMBULATORY_CARE_PROVIDER_SITE_OTHER): Payer: PPO | Admitting: *Deleted

## 2017-10-24 ENCOUNTER — Encounter: Payer: Self-pay | Admitting: Internal Medicine

## 2017-10-24 ENCOUNTER — Ambulatory Visit (INDEPENDENT_AMBULATORY_CARE_PROVIDER_SITE_OTHER): Payer: PPO | Admitting: Internal Medicine

## 2017-10-24 VITALS — BP 114/72 | HR 74 | Temp 97.7°F | Resp 16 | Ht 66.0 in | Wt 161.0 lb

## 2017-10-24 VITALS — BP 114/72 | HR 74 | Resp 16 | Ht 66.0 in | Wt 161.0 lb

## 2017-10-24 DIAGNOSIS — M85851 Other specified disorders of bone density and structure, right thigh: Secondary | ICD-10-CM

## 2017-10-24 DIAGNOSIS — M85852 Other specified disorders of bone density and structure, left thigh: Secondary | ICD-10-CM

## 2017-10-24 DIAGNOSIS — Z0000 Encounter for general adult medical examination without abnormal findings: Secondary | ICD-10-CM

## 2017-10-24 DIAGNOSIS — L719 Rosacea, unspecified: Secondary | ICD-10-CM

## 2017-10-24 DIAGNOSIS — M353 Polymyalgia rheumatica: Secondary | ICD-10-CM | POA: Diagnosis not present

## 2017-10-24 MED ORDER — METRONIDAZOLE 0.75 % EX CREA: TOPICAL_CREAM | Freq: Two times a day (BID) | CUTANEOUS | 5 refills | Status: DC

## 2017-10-24 MED ORDER — CALCIUM-MAGNESIUM-VITAMIN D 300-20-200 MG-MG-UNIT PO CHEW: CHEWABLE_TABLET | ORAL | Status: DC

## 2017-10-24 NOTE — Assessment & Plan Note (Addendum)
Taking calcium and vitamin d Walking about 1 mile Increase exercise Check vitamin d level

## 2017-10-24 NOTE — Assessment & Plan Note (Signed)
No current symptoms

## 2017-10-24 NOTE — Progress Notes (Addendum)
Subjective:   Gail Alvarez is a 67 y.o. female who presents for an Initial Medicare Annual Wellness Visit.  Review of Systems    No ROS.  Medicare Wellness Visit. Additional risk factors are reflected in the social history.   Cardiac Risk Factors include: advanced age (>59men, >33 women) Sleep patterns: gets up 1-2 times nightly to void and sleeps 7-8 hours nightly.    Home Safety/Smoke Alarms: Feels safe in home. Smoke alarms in place.  Living environment; residence and Firearm Safety: 1-story house/ trailer, no firearms. Lives alone, no needs for DME, good support system Seat Belt Safety/Bike Helmet: Wears seat belt.    Objective:    Today's Vitals   10/24/17 1406  BP: 114/72  Pulse: 74  Resp: 16  SpO2: 98%  Weight: 161 lb (73 kg)  Height: 5\' 6"  (1.676 m)   Body mass index is 25.99 kg/m.  Advanced Directives 10/24/2017  Does Patient Have a Medical Advance Directive? No  Does patient want to make changes to medical advance directive? Yes (ED - Information included in AVS)    Current Medications (verified) Outpatient Encounter Medications as of 10/24/2017  Medication Sig  . B Complex-Biotin-FA (B COMPLETE PO) Take by mouth daily.  . Cholecalciferol (VITAMIN D-3) 1000 units CAPS Take 5,000 Units by mouth daily.   . Magnesium 200 MG TABS Take by mouth daily.  . metroNIDAZOLE (METROCREAM) 0.75 % cream Apply topically 2 (two) times daily.  . TURMERIC PO Take by mouth daily.  Marland Kitchen UNABLE TO FIND daily. Alive Calcium, K2, Mag, D3, Buron, & Strontion  . [DISCONTINUED] metronidazole (NORITATE) 1 % cream Apply topically daily.   No facility-administered encounter medications on file as of 10/24/2017.     Allergies (verified) Morphine and related and Vivactil [protriptyline hcl]   History: Past Medical History:  Diagnosis Date  . Depression   . Thyroid disease    Past Surgical History:  Procedure Laterality Date  . ABDOMINAL HYSTERECTOMY     ovaries left  . BREAST  BIOPSY  [phone]   Family History  Problem Relation Age of Onset  . Cancer Mother        Cervical & pancreatic  . Hyperlipidemia Mother   . Alzheimer's disease Father   . Alcohol abuse Maternal Grandmother   . Arthritis Maternal Grandmother   . Heart disease Maternal Grandmother   . Alcohol abuse Maternal Grandfather   . Heart disease Maternal Grandfather   . Arthritis Paternal Grandmother    Social History   Socioeconomic History  . Marital status: Single    Spouse name: Not on file  . Number of children: Not on file  . Years of education: Not on file  . Highest education level: Not on file  Occupational History  . Not on file  Social Needs  . Financial resource strain: Not hard at all  . Food insecurity:    Worry: Never true    Inability: Never true  . Transportation needs:    Medical: No    Non-medical: No  Tobacco Use  . Smoking status: Former Research scientist (life sciences)  . Smokeless tobacco: Never Used  Substance and Sexual Activity  . Alcohol use: Yes    Comment: social  . Drug use: No  . Sexual activity: Not Currently  Lifestyle  . Physical activity:    Days per week: 0 days    Minutes per session: 0 min  . Stress: Not at all  Relationships  . Social connections:    Talks on  phone: More than three times a week    Gets together: More than three times a week    Attends religious service: More than 4 times per year    Active member of club or organization: Not on file    Attends meetings of clubs or organizations: Not on file    Relationship status: Not on file  Other Topics Concern  . Not on file  Social History Narrative   Exercise:  No regular exercise    Tobacco Counseling Counseling given: Not Answered  Activities of Daily Living In your present state of health, do you have any difficulty performing the following activities: 10/24/2017  Hearing? N  Vision? N  Difficulty concentrating or making decisions? N  Walking or climbing stairs? N  Dressing or  bathing? N  Doing errands, shopping? N  Preparing Food and eating ? N  Using the Toilet? N  In the past six months, have you accidently leaked urine? N  Do you have problems with loss of bowel control? N  Managing your Medications? N  Managing your Finances? N  Housekeeping or managing your Housekeeping? N  Some recent data might be hidden     Immunizations and Health Maintenance Immunization History  Administered Date(s) Administered  . PPD Test 11/13/2014   Health Maintenance Due  Topic Date Due  . MAMMOGRAM  09/17/2017    Patient Care Team: Binnie Rail, MD as PCP - General (Internal Medicine)  Indicate any recent Medical Services you may have received from other than [facility] providers in the past year (date may be approximate).     Assessment:   This is a routine wellness examination for [name]. Physical assessment deferred to PCP.   Hearing/Vision screen Hearing Screening Comments: Able to hear conversational tones w/o difficulty. No issues reported.  Passed whisper test Vision Screening Comments: appointment yearly   Dietary issues and exercise activities discussed: Current Exercise Habits: Home exercise routine, Type of exercise: walking, Time (Minutes): 30, Frequency (Times/Week): 5, Weekly Exercise (Minutes/Week): 150, Intensity: Mild, Exercise limited by: None identified  Diet (meal preparation, eat out, water intake, caffeinated beverages, dairy products, fruits and vegetables): in general, a "healthy" diet  , well balanced   Reviewed heart healthy diet, encouraged patient to increase daily water intake.    Goals    . Patient Stated     I am inspired to check out classes at the [facility], continue to go to church, dance around the house and become more active in the community.      Depression Screen PHQ 2/9 Scores 10/24/2017  PHQ - 2 Score 2  PHQ- 9 Score 4    Fall Risk Fall Risk  10/24/2017  Falls in the past year? No     Cognitive Function: MMSE  - Mini Mental State Exam 10/24/2017  Orientation to time 5  Orientation to Place 5  Registration 3  Attention/ Calculation 5  Recall 2  Language- name 2 objects 2  Language- repeat 1  Language- follow 3 step command 3  Language- read & follow direction 1  Write a sentence 1  Copy design 1  Total score 29        Screening Tests Health Maintenance  Topic Date Due  . MAMMOGRAM  09/17/2017  . INFLUENZA VACCINE  06/26/2018 (Originally 03/02/2017)  . TETANUS/TDAP  10/25/2018 (Originally 10/03/1967)  . PNA vac Low Risk Adult (1 of 2 - PCV13) 10/24/2021 (Originally 10/02/2013)  . DEXA SCAN  10/15/2019  . COLONOSCOPY  03/13/2024  . Hepatitis C Screening  Completed     Plan:    Continue doing brain stimulating activities (puzzles, reading, adult coloring books, staying active) to keep memory sharp.   Continue to eat heart healthy diet (full of fruits, vegetables, whole grains, lean protein, water--limit salt, fat, and sugar intake) and increase physical activity as tolerated.  I have personally reviewed and noted the following in the patient's chart:   . Medical and social history . Use of alcohol, tobacco or illicit drugs  . Current medications and supplements . Functional ability and status . Nutritional status . Physical activity . Advanced directives . List of other physicians . Vitals . Screenings to include cognitive, depression, and falls . Referrals and appointments  In addition, I have reviewed and discussed with patient certain preventive protocols, quality metrics, and best practice recommendations. A written personalized care plan for preventive services as well as general preventive health recommendations were provided to patient.     Michiel Cowboy, RN   10/24/2017     Medical screening examination/treatment/procedure(s) were performed by non-physician practitioner and as supervising physician I was immediately available for consultation/collaboration. I agree with  above. Binnie Rail, MD

## 2017-10-24 NOTE — Patient Instructions (Addendum)
www.auntbertha.com or down load app on smart phone Aunt Berenice Primas website lists multiple social resources for individuals such as: food, health, money, house hold goods, transit, medical supplies, job training and legal services.  [employer] organization in [place], [place] Address: [address] [place], [place] [address] Phone: [phone]  Continue doing brain stimulating activities (puzzles, reading, adult coloring books, staying active) to keep memory sharp.   Continue to eat heart healthy diet (full of fruits, vegetables, whole grains, lean protein, water--limit salt, fat, and sugar intake) and increase physical activity as tolerated.   [name] , Thank you for taking time to come for your Medicare Wellness Visit. I appreciate your ongoing commitment to your health goals. Please review the following plan we discussed and let me know if I can assist you in the future.   These are the goals we discussed: Goals    . Patient Stated     I am inspired to check out classes at the [facility], continue to go to church, dance around the house and become more active in the community.       This is a list of the screening recommended for you and due dates:  Health Maintenance  Topic Date Due  . Mammogram  09/17/2017  . Flu Shot  06/26/2018*  . Tetanus Vaccine  10/25/2018*  . Pneumonia vaccines (1 of 2 - PCV13) 10/24/2021*  . DEXA scan (bone density measurement)  10/15/2019  . Colon Cancer Screening  03/13/2024  .  Hepatitis C: One time screening is recommended by Center for Disease Control  (CDC) for  adults born from [birth year] through 1967.   Completed  *Topic was postponed. The date shown is not the original due date.

## 2017-10-24 NOTE — Assessment & Plan Note (Signed)
Using metronidazole cream  0.75% Refilled today

## 2017-10-25 ENCOUNTER — Encounter: Payer: Self-pay | Admitting: Internal Medicine

## 2017-10-27 ENCOUNTER — Telehealth: Payer: Self-pay | Admitting: Internal Medicine

## 2017-10-27 NOTE — Telephone Encounter (Signed)
Please let her know that I am unable to write her a letter for [employer] - I can not medically justify the letter.

## 2017-10-28 NOTE — Telephone Encounter (Signed)
LVM informing pt

## 2017-11-06 ENCOUNTER — Telehealth: Payer: Self-pay | Admitting: Internal Medicine

## 2017-11-06 NOTE — Telephone Encounter (Signed)
Bone density shows osteopenia.   She should be exercising regularly and taking calcium and vitamin d daily ideally to help maintain her bone density.  We will recheck in 2 years

## 2017-11-07 ENCOUNTER — Encounter: Payer: Self-pay | Admitting: Emergency Medicine

## 2017-11-07 NOTE — Telephone Encounter (Signed)
Unable to reach pt via phone number on chart. Mailing results.

## 2017-11-08 ENCOUNTER — Encounter: Payer: Self-pay | Admitting: Internal Medicine

## 2018-04-18 DIAGNOSIS — L821 Other seborrheic keratosis: Secondary | ICD-10-CM | POA: Diagnosis not present

## 2018-04-18 DIAGNOSIS — Z419 Encounter for procedure for purposes other than remedying health state, unspecified: Secondary | ICD-10-CM | POA: Diagnosis not present

## 2018-08-03 NOTE — Progress Notes (Signed)
Subjective:    Patient ID: Gail Alvarez, female    DOB: 04-07-1951, 68 y.o.   MRN: [id number]  HPI The patient is here for an acute visit.   She would like to have hormone testing.    She had severe anxiety, depression in august after watching 8 episodes of the game of thrones.  She did not sleep for two weeks.  She was not eating.  She lost weight.  She has a history of chronic depression and she had something similar in the past and fixed it with st johns wart.  She started taking St. John's wort and it did help and currently she denies any depression or anxiety.  Prior to this she had watched several episodes of outlander in a short time period.  There was a left story and this reminded her of a prior boyfriend.  This is a boyfriend she dated at 64 years of age.  He was the best lever she ever had.  It was a very painful relationship because he cheated on her and at other things that were very hurtful.  She did get pregnant at that time and ended up having abortion.  She started remembering things that she did not remember and felt like she had to rework through all of this.  She thought she had dealt with it but obviously did not.  1 of the things that did happen was that her sex drive went through the roof and she has been incredibly horny since then.  She never wanted to have a relationship and now wants to have a relationship.  She feels better in some ways.  She has noticed that her hair is thinning and is concerned about hormonal oral vitamin issue.  She also has a yeast infection.  She did try Monistat twice and it did not work-one time it fell out near the time something else happened.  She has been using some natural remedies and it seems to be helping.  She has some vaginal discharged - white and thick.  She denies odor.  She denies itching now, but she did.   She tried some tea tree oil supplements.  She has been eating yogurt.   BP has been controlled when she checks it -  120's  Medications and allergies reviewed with patient and updated if appropriate.  Patient Active Problem List   Diagnosis Date Noted  . Arthralgia 03/08/2017  . Plantar fasciitis of right foot 08/12/2015  . Osteopenia 02/13/2014  . Polymyalgia rheumatica ([facility] [place]) 10/15/2013  . Acne erythematosa 10/15/2013    Current Outpatient Medications on File Prior to Visit  Medication Sig Dispense Refill  . B Complex-Biotin-FA (B COMPLETE PO) Take by mouth daily.    . Cholecalciferol (VITAMIN D-3) 1000 units CAPS Take 5,000 Units by mouth daily.     . Magnesium 200 MG TABS Take by mouth daily.    . metroNIDAZOLE (METROCREAM) 0.75 % cream Apply topically 2 (two) times daily. 45 g 5  . Omega-3 Fatty Acids (FISH OIL) 1000 MG CAPS Take by mouth.    . TURMERIC PO Take by mouth daily.    Marland Kitchen UNABLE TO FIND daily. Alive Calcium, K2, Mag, D3, Buron, & Strontion     No current facility-administered medications on file prior to visit.     Past Medical History:  Diagnosis Date  . Depression   . Thyroid disease     Past Surgical History:  Procedure Laterality Date  . ABDOMINAL HYSTERECTOMY  ovaries left  . BREAST BIOPSY  [phone]    Social History   Socioeconomic History  . Marital status: Single    Spouse name: Not on file  . Number of children: Not on file  . Years of education: Not on file  . Highest education level: Not on file  Occupational History  . Not on file  Social Needs  . Financial resource strain: Not hard at all  . Food insecurity:    Worry: Never true    Inability: Never true  . Transportation needs:    Medical: No    Non-medical: No  Tobacco Use  . Smoking status: Former Research scientist (life sciences)  . Smokeless tobacco: Never Used  Substance and Sexual Activity  . Alcohol use: Yes    Comment: social  . Drug use: No  . Sexual activity: Not Currently  Lifestyle  . Physical activity:    Days per week: 0 days    Minutes per session: 0 min  . Stress: Not at all   Relationships  . Social connections:    Talks on phone: More than three times a week    Gets together: More than three times a week    Attends religious service: More than 4 times per year    Active member of club or organization: Not on file    Attends meetings of clubs or organizations: Not on file    Relationship status: Not on file  Other Topics Concern  . Not on file  Social History Narrative   Exercise:  No regular exercise    Family History  Problem Relation Age of Onset  . Cancer Mother        Cervical & pancreatic  . Hyperlipidemia Mother   . Alzheimer's disease Father   . Alcohol abuse Maternal Grandmother   . Arthritis Maternal Grandmother   . Heart disease Maternal Grandmother   . Alcohol abuse Maternal Grandfather   . Heart disease Maternal Grandfather   . Arthritis Paternal Grandmother     Review of Systems  Constitutional: Positive for unexpected weight change (weight loss).  HENT: Positive for sneezing.   Respiratory: Positive for cough (recently with sneezing). Negative for shortness of breath and wheezing.   Cardiovascular: Positive for palpitations (occ). Negative for chest pain and leg swelling.  Gastrointestinal: Negative for abdominal pain, constipation, diarrhea and nausea.  Skin: Negative for color change.       Hair thinning/loss No darkening of skin  Neurological: Negative for light-headedness and headaches.  Psychiatric/Behavioral: Negative for dysphoric mood (not at this point).       Objective:   Vitals:   08/04/18 1449  BP: 138/78  Pulse: 77  Resp: 16  Temp: 97.8 F (36.6 C)  SpO2: 97%   BP Readings from Last 3 Encounters:  08/04/18 138/78  10/24/17 114/72  10/24/17 114/72   Wt Readings from Last 3 Encounters:  08/04/18 152 lb (68.9 kg)  10/24/17 161 lb (73 kg)  10/24/17 161 lb (73 kg)   Body mass index is 24.53 kg/m.   Physical Exam    Constitutional: Appears well-developed and well-nourished. No distress.  HENT:   Head: Normocephalic and atraumatic.  Neck: Neck supple. No tracheal deviation present. No thyromegaly present.  No cervical lymphadenopathy Cardiovascular: Normal rate, regular rhythm and normal heart sounds.   No murmur heard. No carotid bruit .  No edema Pulmonary/Chest: Effort normal and breath sounds normal. No respiratory distress. No has no wheezes. No rales.  Genitourinary: Deferred Skin: Skin  is warm and dry. Not diaphoretic. No increase in skin pigmentation.  No rashes.  Mild thinning of hair top of head Psychiatric: Normal mood and affect. Behavior is normal.       Assessment & Plan:   She is also experienced an increase in libido.  Likely related to remembering her previous boyfriend.  Again I told her I do not think this is hormonal in nature.  If any of her symptoms do not improve or worsen she will let me know and we can refer to endocrine to rule out hormonal issue/adrenal issue.  Have ordered basic blood work and will see what that shows including TFTs, CMP, CBC, vitamin D and B12  See Problem List for Assessment and Plan of chronic medical problems.

## 2018-08-04 ENCOUNTER — Encounter: Payer: Self-pay | Admitting: Internal Medicine

## 2018-08-04 ENCOUNTER — Ambulatory Visit (INDEPENDENT_AMBULATORY_CARE_PROVIDER_SITE_OTHER): Payer: PPO | Admitting: Internal Medicine

## 2018-08-04 ENCOUNTER — Telehealth: Payer: Self-pay | Admitting: Internal Medicine

## 2018-08-04 ENCOUNTER — Other Ambulatory Visit (INDEPENDENT_AMBULATORY_CARE_PROVIDER_SITE_OTHER): Payer: PPO

## 2018-08-04 DIAGNOSIS — B373 Candidiasis of vulva and vagina: Secondary | ICD-10-CM | POA: Insufficient documentation

## 2018-08-04 DIAGNOSIS — R634 Abnormal weight loss: Secondary | ICD-10-CM | POA: Diagnosis not present

## 2018-08-04 DIAGNOSIS — L659 Nonscarring hair loss, unspecified: Secondary | ICD-10-CM

## 2018-08-04 DIAGNOSIS — B3731 Acute candidiasis of vulva and vagina: Secondary | ICD-10-CM

## 2018-08-04 LAB — COMPREHENSIVE METABOLIC PANEL
ALBUMIN: 4.4 g/dL (ref 3.5–5.2)
ALK PHOS: 75 U/L (ref 39–117)
ALT: 18 U/L (ref 0–35)
AST: 17 U/L (ref 0–37)
BILIRUBIN TOTAL: 0.4 mg/dL (ref 0.2–1.2)
BUN: 16 mg/dL (ref 6–23)
CALCIUM: 10.2 mg/dL (ref 8.4–10.5)
CO2: 27 mEq/L (ref 19–32)
CREATININE: 0.78 mg/dL (ref 0.40–1.20)
Chloride: 105 mEq/L (ref 96–112)
GFR: 77.64 mL/min (ref >60.00)
Glucose, Bld: 97 mg/dL (ref 70–99)
Potassium: 4.1 mEq/L (ref 3.5–5.1)
Sodium: 140 mEq/L (ref 135–145)
TOTAL PROTEIN: 7.5 g/dL (ref 6.0–8.3)

## 2018-08-04 LAB — T3, FREE: T3 FREE: 3.2 pg/mL (ref 2.3–4.2)

## 2018-08-04 LAB — CBC WITH DIFFERENTIAL/PLATELET
Basophils Absolute: 0.1 10*3/uL (ref 0.0–0.1)
Basophils Relative: 1.4 % (ref 0.0–3.0)
Eosinophils Absolute: 0.3 10*3/uL (ref 0.0–0.7)
Eosinophils Relative: 4.4 % (ref 0.0–5.0)
HCT: 43.8 % (ref 36.0–46.0)
Hemoglobin: 14.7 g/dL (ref 12.0–15.0)
Lymphocytes Relative: 36.9 % (ref 12.0–46.0)
Lymphs Abs: 2.9 10*3/uL (ref 0.7–4.0)
MCHC: 33.6 g/dL (ref 30.0–36.0)
MCV: 91.1 fl (ref 78.0–100.0)
Monocytes Absolute: 0.6 10*3/uL (ref 0.1–1.0)
Monocytes Relative: 7.2 % (ref 3.0–12.0)
NEUTROS PCT: 50.1 % (ref 43.0–77.0)
Neutro Abs: 3.9 10*3/uL (ref 1.4–7.7)
Platelets: 278 10*3/uL (ref 150.0–400.0)
RBC: 4.8 Mil/uL (ref 3.87–5.11)
RDW: 13.3 % (ref 11.5–15.5)
WBC: 7.7 10*3/uL (ref 4.0–10.5)

## 2018-08-04 LAB — TSH: TSH: 1.72 u[IU]/mL (ref 0.35–4.50)

## 2018-08-04 LAB — VITAMIN B12: Vitamin B-12: 932 pg/mL — ABNORMAL HIGH (ref 211–911)

## 2018-08-04 LAB — T4, FREE: Free T4: 0.83 ng/dL (ref 0.60–1.60)

## 2018-08-04 LAB — VITAMIN D 25 HYDROXY (VIT D DEFICIENCY, FRACTURES): VITD: 99.96 ng/mL (ref 30.00–100.00)

## 2018-08-04 NOTE — Telephone Encounter (Signed)
Patient would like a copy of labs mailed to her home address once they are resulted.

## 2018-08-04 NOTE — Patient Instructions (Signed)
  Tests ordered today. Your results will be released to MyChart (or called to you) after review, usually within 72hours after test completion. If any changes need to be made, you will be notified at that same time.  Medications reviewed and updated.  Changes include :   none     

## 2018-08-04 NOTE — Assessment & Plan Note (Signed)
She had an episode of weight loss that was related to not eating from depression She currently denies depression or anxiety/stress She has been eating more and has gained weight Discussed that I do not think this is a concern at this time and it was all likely related to that very stressful episode that happened a few months ago Will check TFTs, but unlikely related to thyroid issue

## 2018-08-04 NOTE — Assessment & Plan Note (Signed)
Has improved with tea tree oil and other natural remedies Defer Diflucan She will let me know if her symptoms do not completely resolve

## 2018-08-04 NOTE — Assessment & Plan Note (Addendum)
Top of head thinning-mild in nature Likely related to intense period of stress We will check TFTs, B12, vitamin D and other basic blood work If she wants estrogen/testosterone testing she will need to see endocrine-discussed I do not think that is the cause Likely will improve with time since she is doing better emotionally

## 2018-08-07 NOTE — Telephone Encounter (Signed)
Results sent in the mail. 

## 2018-12-12 ENCOUNTER — Telehealth: Payer: Self-pay | Admitting: Internal Medicine

## 2018-12-12 MED ORDER — METRONIDAZOLE 0.75 % EX CREA: TOPICAL_CREAM | Freq: Two times a day (BID) | CUTANEOUS | 5 refills | Status: DC

## 2018-12-12 NOTE — Telephone Encounter (Signed)
Copied from [place] [id number]. Topic: Quick Communication - Rx Refill/Question >> Dec 12, 2018  2:18 PM [place], [place] D wrote: Medication: metroNIDAZOLE (METROCREAM) 0.75 % cream  Has the patient contacted their pharmacy? Yes.   (Agent: If no, request that the patient contact the pharmacy for the refill.) (Agent: If yes, when and what did the pharmacy advise?)  Preferred Pharmacy (with phone number or street name): [facility] [place], [address] AT [phone] (Phone) [phone] (Fax)    Agent: Please be advised that RX refills may take up to 3 business days. We ask that you follow-up with your pharmacy.

## 2019-03-05 ENCOUNTER — Other Ambulatory Visit: Payer: Self-pay

## 2019-10-29 ENCOUNTER — Telehealth: Payer: Self-pay

## 2019-10-29 NOTE — Telephone Encounter (Signed)
Spoke with pt and advised that she need to be seen. She refused any sort of visit at this time. She will see how she does and call us if she feels like she needs to come in the office.

## 2019-10-29 NOTE — Telephone Encounter (Signed)
  New message  The patient voice she was cleaning in the yard on Saturday went to shower notice a tick bite, red bump, and itchy.   I advise the patient MD would need to see her before she can prescribe any medication, the patient voiced she fostering a dog and the dog chews a lot.   The patient voiced why can't Dr. Quay Burow called me in something I can't leave.  The patient is aware a message will be sent around to the MD to review   No appt is made at this time.

## 2019-10-31 ENCOUNTER — Telehealth: Payer: Self-pay | Admitting: Internal Medicine

## 2019-10-31 NOTE — Telephone Encounter (Signed)
New message:   Pt states she has a tick bite and has an appt for 11/05/19 but wants to know is this appt too long to wait to start antibiotics for the bite. Please advise.

## 2019-10-31 NOTE — Telephone Encounter (Signed)
LVM letting pt know.  

## 2019-10-31 NOTE — Telephone Encounter (Signed)
Can schedule for tomorrow.  May or may not need abx

## 2019-11-01 ENCOUNTER — Other Ambulatory Visit: Payer: Self-pay

## 2019-11-01 ENCOUNTER — Ambulatory Visit (INDEPENDENT_AMBULATORY_CARE_PROVIDER_SITE_OTHER): Payer: PPO | Admitting: Internal Medicine

## 2019-11-01 ENCOUNTER — Encounter: Payer: Self-pay | Admitting: Internal Medicine

## 2019-11-01 VITALS — BP 144/80 | HR 73 | Temp 97.8°F | Resp 16 | Ht 66.0 in

## 2019-11-01 DIAGNOSIS — W57XXXA Bitten or stung by nonvenomous insect and other nonvenomous arthropods, initial encounter: Secondary | ICD-10-CM

## 2019-11-01 DIAGNOSIS — S70262A Insect bite (nonvenomous), left hip, initial encounter: Secondary | ICD-10-CM

## 2019-11-01 MED ORDER — DOXYCYCLINE HYCLATE 100 MG PO TABS: 100.0000 mg | ORAL_TABLET | Freq: Two times a day (BID) | ORAL | 0 refills | Status: DC

## 2019-11-01 NOTE — Assessment & Plan Note (Signed)
Acute Deer tick - on for > 24 hrs No evidence of cellulitis Concern for risk of lyme Will rx doxycycline bid x 2 weeks She will monitor area and for symptoms of lyme

## 2019-11-01 NOTE — Assessment & Plan Note (Signed)
Acute Insect bite - deer tick left anterior hip Mild erythema  - not infection Concern for increased risk of lyme  Start doxycycline

## 2019-11-01 NOTE — Patient Instructions (Addendum)
Take the doxycycline twice a day for two weeks.  If you have side effects please call us.    If you develop any any concerning symptoms please call and let us know.

## 2019-11-01 NOTE — Progress Notes (Signed)
Subjective:    Patient ID: Gail Alvarez, female    DOB: May 13, 1951, 69 y.o.   MRN: [id number]  HPI The patient is here for an acute visit.   Tick bite:  She was bit by a tick 5 days ago.  It was a deer tick and on her for at least 24 hrs - she is not exactly sure how long.  It was very small and not engorged.  It  Bite is still red and has expanded in a linear fashion.  It is itching and she applied Tea tree oil and echinacea tea.     She has had other tick bites recently.  Her hip aches a little, but doubts that is related.  She denies other symptoms.  She is concerned about lyme disease.    Medications and allergies reviewed with patient and updated if appropriate.  Patient Active Problem List   Diagnosis Date Noted  . Vaginal yeast infection 08/04/2018  . Hair thinning 08/04/2018  . Weight loss 08/04/2018  . Arthralgia 03/08/2017  . Plantar fasciitis of right foot 08/12/2015  . Osteopenia 02/13/2014  . Polymyalgia rheumatica ([place]) 10/15/2013  . Acne erythematosa 10/15/2013    Current Outpatient Medications on File Prior to Visit  Medication Sig Dispense Refill  . B Complex-Biotin-FA (B COMPLETE PO) Take by mouth daily.    . Cholecalciferol (VITAMIN D-3) 1000 units CAPS Take 5,000 Units by mouth daily.     . Magnesium 200 MG TABS Take by mouth daily.    . metroNIDAZOLE (METROCREAM) 0.75 % cream Apply topically 2 (two) times daily. 45 g 5  . Omega-3 Fatty Acids (FISH OIL) 1000 MG CAPS Take by mouth.    . TURMERIC PO Take by mouth daily.    Marland Kitchen UNABLE TO FIND daily. Alive Calcium, K2, Mag, D3, Buron, & Strontion    . ZINC SULFATE PO Take by mouth.     No current facility-administered medications on file prior to visit.    Past Medical History:  Diagnosis Date  . Depression   . Thyroid disease     Past Surgical History:  Procedure Laterality Date  . ABDOMINAL HYSTERECTOMY     ovaries left  . BREAST BIOPSY  [phone]    Social History   Socioeconomic  History  . Marital status: Single    Spouse name: Not on file  . Number of children: Not on file  . Years of education: Not on file  . Highest education level: Not on file  Occupational History  . Not on file  Tobacco Use  . Smoking status: Former Research scientist (life sciences)  . Smokeless tobacco: Never Used  Substance and Sexual Activity  . Alcohol use: Yes    Comment: social  . Drug use: No  . Sexual activity: Not Currently  Other Topics Concern  . Not on file  Social History Narrative   Exercise:  No regular exercise   Social Determinants of Health   Financial Resource Strain:   . Difficulty of Paying Living Expenses:   Food Insecurity:   . Worried About Charity fundraiser in the Last Year:   . Arboriculturist in the Last Year:   Transportation Needs:   . Film/video editor (Medical):   Marland Kitchen Lack of Transportation (Non-Medical):   Physical Activity:   . Days of Exercise per Week:   . Minutes of Exercise per Session:   Stress:   . Feeling of Stress :   Social Connections:   .  Frequency of Communication with Friends and Family:   . Frequency of Social Gatherings with Friends and Family:   . Attends Religious Services:   . Active Member of Clubs or Organizations:   . Attends Archivist Meetings:   Marland Kitchen Marital Status:     Family History  Problem Relation Age of Onset  . Cancer Mother        Cervical & pancreatic  . Hyperlipidemia Mother   . Alzheimer's disease Father   . Alcohol abuse Maternal Grandmother   . Arthritis Maternal Grandmother   . Heart disease Maternal Grandmother   . Alcohol abuse Maternal Grandfather   . Heart disease Maternal Grandfather   . Arthritis Paternal Grandmother     Review of Systems  Constitutional: Negative for chills, fatigue and fever.  Musculoskeletal: Negative for myalgias.  Skin: Negative for rash.  Neurological: Negative for headaches.       Objective:   Vitals:   11/01/19 1020  BP: (!) 144/80  Pulse: 73  Resp: 16  Temp:  97.8 F (36.6 C)  SpO2: 99%   BP Readings from Last 3 Encounters:  11/01/19 (!) 144/80  08/04/18 138/78  10/24/17 114/72   Wt Readings from Last 3 Encounters:  08/04/18 152 lb (68.9 kg)  10/24/17 161 lb (73 kg)  10/24/17 161 lb (73 kg)   Body mass index is 24.53 kg/m.   Physical Exam Constitutional:      General: She is not in acute distress.    Appearance: Normal appearance. She is not ill-appearing.  HENT:     Head: Normocephalic and atraumatic.  Skin:    General: Skin is warm and dry.     Findings: Erythema (bite on left anterior hip - linear red extension from bite, no target lesion) present.  Neurological:     Mental Status: She is alert.            Assessment & Plan:    See Problem List for Assessment and Plan of chronic medical problems.    This visit occurred during the SARS-CoV-2 public health emergency.  Safety protocols were in place, including screening questions prior to the visit, additional usage of staff PPE, and extensive cleaning of exam room while observing appropriate contact time as indicated for disinfecting solutions.

## 2019-11-05 ENCOUNTER — Ambulatory Visit: Payer: PPO | Admitting: Internal Medicine

## 2019-11-13 DIAGNOSIS — M8589 Other specified disorders of bone density and structure, multiple sites: Secondary | ICD-10-CM | POA: Diagnosis not present

## 2019-11-13 DIAGNOSIS — Z1231 Encounter for screening mammogram for malignant neoplasm of breast: Secondary | ICD-10-CM | POA: Diagnosis not present

## 2019-11-13 LAB — HM DEXA SCAN

## 2019-11-20 ENCOUNTER — Telehealth: Payer: Self-pay

## 2019-11-20 NOTE — Telephone Encounter (Signed)
LVM to call back in regards. 

## 2019-11-20 NOTE — Telephone Encounter (Signed)
New message   The patient calling for breast exams test results.

## 2019-11-21 ENCOUNTER — Encounter: Payer: Self-pay | Admitting: Internal Medicine

## 2019-11-22 ENCOUNTER — Encounter: Payer: Self-pay | Admitting: Internal Medicine

## 2020-01-30 DIAGNOSIS — N62 Hypertrophy of breast: Secondary | ICD-10-CM | POA: Diagnosis not present

## 2020-02-19 DIAGNOSIS — H2511 Age-related nuclear cataract, right eye: Secondary | ICD-10-CM | POA: Diagnosis not present

## 2020-02-19 DIAGNOSIS — H04123 Dry eye syndrome of bilateral lacrimal glands: Secondary | ICD-10-CM | POA: Diagnosis not present

## 2020-02-19 DIAGNOSIS — H2512 Age-related nuclear cataract, left eye: Secondary | ICD-10-CM | POA: Diagnosis not present

## 2020-02-29 DIAGNOSIS — H25812 Combined forms of age-related cataract, left eye: Secondary | ICD-10-CM | POA: Diagnosis not present

## 2020-02-29 DIAGNOSIS — H2512 Age-related nuclear cataract, left eye: Secondary | ICD-10-CM | POA: Diagnosis not present

## 2020-08-04 NOTE — Progress Notes (Signed)
Subjective:    Patient ID: Gail Alvarez, female    DOB: 06/22/1951, 70 y.o.   MRN: [id number]  HPI The patient is here for an acute visit.   ? Bladder infection - she took some nautural remedies and that has helped.  She is not sure if she still has an infection.  Sometimes has pressure in bladder.  She denies fever, dysuria, increased frequency, hematuria and nausea.  Medications and allergies reviewed with patient and updated if appropriate.  Patient Active Problem List   Diagnosis Date Noted  . Tick bite 11/01/2019  . Insect bite 11/01/2019  . Vaginal yeast infection 08/04/2018  . Hair thinning 08/04/2018  . Weight loss 08/04/2018  . Arthralgia 03/08/2017  . Plantar fasciitis of right foot 08/12/2015  . Osteopenia, high frax 02/13/2014  . Polymyalgia rheumatica ([facility]) 10/15/2013  . Acne erythematosa 10/15/2013    Current Outpatient Medications on File Prior to Visit  Medication Sig Dispense Refill  . B Complex-Biotin-FA (B COMPLETE PO) Take by mouth daily.    . calcium carbonate (OSCAL) 1500 (600 Ca) MG TABS tablet Take by mouth.    . Cholecalciferol (VITAMIN D-3) 1000 units CAPS Take 5,000 Units by mouth daily.     . Cholecalciferol (VITAMIN D3) 100000 UNIT/GM POWD Take by mouth.    . doxycycline (VIBRA-TABS) 100 MG tablet Take 1 tablet (100 mg total) by mouth 2 (two) times daily. 28 tablet 0  . Magnesium 200 MG TABS Take by mouth daily.    . metroNIDAZOLE (METROCREAM) 0.75 % cream Apply topically 2 (two) times daily. 45 g 5  . Omega-3 Fatty Acids (FISH OIL) 1000 MG CAPS Take by mouth.    . TURMERIC PO Take by mouth daily.    . Turmeric POWD Take by mouth.    Marland Kitchen UNABLE TO FIND daily. Alive Calcium, K2, Mag, D3, Buron, & Strontion    . ZINC SULFATE PO Take by mouth.     No current facility-administered medications on file prior to visit.    Past Medical History:  Diagnosis Date  . Depression   . Thyroid disease     Past Surgical History:  Procedure Laterality  Date  . ABDOMINAL HYSTERECTOMY     ovaries left  . BREAST BIOPSY  [phone]    Social History   Socioeconomic History  . Marital status: Single    Spouse name: Not on file  . Number of children: Not on file  . Years of education: Not on file  . Highest education level: Not on file  Occupational History  . Not on file  Tobacco Use  . Smoking status: Former Games developer  . Smokeless tobacco: Never Used  Vaping Use  . Vaping Use: Never used  Substance and Sexual Activity  . Alcohol use: Yes    Comment: social  . Drug use: No  . Sexual activity: Not Currently  Other Topics Concern  . Not on file  Social History Narrative   Exercise:  No regular exercise   Social Determinants of Health   Financial Resource Strain: Not on file  Food Insecurity: Not on file  Transportation Needs: Not on file  Physical Activity: Not on file  Stress: Not on file  Social Connections: Not on file    Family History  Problem Relation Age of Onset  . Cancer Mother        Cervical & pancreatic  . Hyperlipidemia Mother   . Alzheimer's disease Father   . Alcohol abuse Maternal Grandmother   .  Arthritis Maternal Grandmother   . Heart disease Maternal Grandmother   . Alcohol abuse Maternal Grandfather   . Heart disease Maternal Grandfather   . Arthritis Paternal Grandmother     Review of Systems  Constitutional: Negative for chills, fatigue and fever.  Gastrointestinal: Negative for abdominal pain and nausea.  Genitourinary: Negative for dysuria, frequency and hematuria.       Bladder pressure, urine strong smelling, cloudy urine       Objective:   Vitals:   08/05/20 1427  BP: 126/72  Pulse: 94  Temp: 98.3 F (36.8 C)  SpO2: 98%   BP Readings from Last 3 Encounters:  08/05/20 126/72  11/01/19 (!) 144/80  08/04/18 138/78   Wt Readings from Last 3 Encounters:  08/05/20 151 lb 3.2 oz (68.6 kg)  08/04/18 152 lb (68.9 kg)  10/24/17 161 lb (73 kg)   Body mass index is 24.4  kg/m.   Physical Exam Constitutional:      General: She is not in acute distress.    Appearance: Normal appearance. She is not ill-appearing.  HENT:     Head: Normocephalic and atraumatic.  Abdominal:     General: There is no distension.     Palpations: Abdomen is soft.     Tenderness: There is no abdominal tenderness. There is no right CVA tenderness, left CVA tenderness, guarding or rebound.  Skin:    General: Skin is warm and dry.  Neurological:     Mental Status: She is alert.            Assessment & Plan:    See Problem List for Assessment and Plan of chronic medical problems.    This visit occurred during the SARS-CoV-2 public health emergency.  Safety protocols were in place, including screening questions prior to the visit, additional usage of staff PPE, and extensive cleaning of exam room while observing appropriate contact time as indicated for disinfecting solutions.

## 2020-08-05 ENCOUNTER — Ambulatory Visit (INDEPENDENT_AMBULATORY_CARE_PROVIDER_SITE_OTHER): Payer: PPO | Admitting: Internal Medicine

## 2020-08-05 ENCOUNTER — Other Ambulatory Visit: Payer: Self-pay

## 2020-08-05 ENCOUNTER — Encounter: Payer: Self-pay | Admitting: Internal Medicine

## 2020-08-05 VITALS — BP 126/72 | HR 94 | Temp 98.3°F | Ht 66.0 in | Wt 151.2 lb

## 2020-08-05 DIAGNOSIS — E673 Hypervitaminosis D: Secondary | ICD-10-CM | POA: Insufficient documentation

## 2020-08-05 DIAGNOSIS — R3989 Other symptoms and signs involving the genitourinary system: Secondary | ICD-10-CM

## 2020-08-05 LAB — POC URINALSYSI DIPSTICK (AUTOMATED)
Bilirubin, UA: NEGATIVE
Blood, UA: NEGATIVE
Glucose, UA: NEGATIVE
Ketones, UA: NEGATIVE
Leukocytes, UA: NEGATIVE
Nitrite, UA: NEGATIVE
Protein, UA: NEGATIVE
Spec Grav, UA: 1.01 (ref 1.010–1.025)
Urobilinogen, UA: 0.2 E.U./dL
pH, UA: 7 (ref 5.0–8.0)

## 2020-08-05 LAB — VITAMIN D 25 HYDROXY (VIT D DEFICIENCY, FRACTURES): VITD: 80.03 ng/mL (ref 30.00–100.00)

## 2020-08-05 NOTE — Assessment & Plan Note (Signed)
Acute ? uti Urine dip negative for infection Will continue natural remedies for her symptoms - no need for an antibiotic If symptoms do not resolve she will let me know

## 2020-08-05 NOTE — Assessment & Plan Note (Signed)
Chronic H/o of high vitamin d level Taking vitamin d daily Check level

## 2020-08-05 NOTE — Patient Instructions (Signed)
Urine looks normal - there is no infection.    Have blood work done downstairs to check your vitamin d level.

## 2020-11-07 ENCOUNTER — Other Ambulatory Visit: Payer: Self-pay | Admitting: Internal Medicine

## 2021-02-10 DIAGNOSIS — Z1231 Encounter for screening mammogram for malignant neoplasm of breast: Secondary | ICD-10-CM | POA: Diagnosis not present

## 2021-03-12 NOTE — Patient Instructions (Signed)
Blood work was ordered.     Medications changes include :     Your prescription(s) have been submitted to your pharmacy. Please take as directed and contact our office if you believe you are having problem(s) with the medication(s).   A referral was ordered for        Someone from their office will call you to schedule an appointment.    Please followup in 1 year    Health Maintenance, Female Adopting a healthy lifestyle and getting preventive care are important in promoting health and wellness. Ask your health care provider about: The right schedule for you to have regular tests and exams. Things you can do on your own to prevent diseases and keep yourself healthy. What should I know about diet, weight, and exercise? Eat a healthy diet  Eat a diet that includes plenty of vegetables, fruits, low-fat dairy products, and lean protein. Do not eat a lot of foods that are high in solid fats, added sugars, or sodium.  Maintain a healthy weight Body mass index (BMI) is used to identify weight problems. It estimates body fat based on height and weight. Your health care provider can help determineyour BMI and help you achieve or maintain a healthy weight. Get regular exercise Get regular exercise. This is one of the most important things you can do for your health. Most adults should: Exercise for at least 150 minutes each week. The exercise should increase your heart rate and make you sweat (moderate-intensity exercise). Do strengthening exercises at least twice a week. This is in addition to the moderate-intensity exercise. Spend less time sitting. Even light physical activity can be beneficial. Watch cholesterol and blood lipids Have your blood tested for lipids and cholesterol at 72 years of age, then havethis test every 5 years. Have your cholesterol levels checked more often if: Your lipid or cholesterol levels are high. You are older than 72 years of age. You are at high risk for  heart disease. What should I know about cancer screening? Depending on your health history and family history, you may need to have cancer screening at various ages. This may include screening for: Breast cancer. Cervical cancer. Colorectal cancer. Skin cancer. Lung cancer. What should I know about heart disease, diabetes, and high blood pressure? Blood pressure and heart disease High blood pressure causes heart disease and increases the risk of stroke. This is more likely to develop in people who have high blood pressure readings, are of African descent, or are overweight. Have your blood pressure checked: Every 3-5 years if you are 93-35 years of age. Every year if you are 73 years old or older. Diabetes Have regular diabetes screenings. This checks your fasting blood sugar level. Have the screening done: Once every three years after age 46 if you are at a normal weight and have a low risk for diabetes. More often and at a younger age if you are overweight or have a high risk for diabetes. What should I know about preventing infection? Hepatitis B If you have a higher risk for hepatitis B, you should be screened for this virus. Talk with your health care provider to find out if you are at risk forhepatitis B infection. Hepatitis C Testing is recommended for: Everyone born from [birth year] through 1965. Anyone with known risk factors for hepatitis C. Sexually transmitted infections (STIs) Get screened for STIs, including gonorrhea and chlamydia, if: You are sexually active and are younger than 72 years of age. You are  older than 73 years of age and your health care provider tells you that you are at risk for this type of infection. Your sexual activity has changed since you were last screened, and you are at increased risk for chlamydia or gonorrhea. Ask your health care provider if you are at risk. Ask your health care provider about whether you are at high risk for HIV. Your health care  provider may recommend a prescription medicine to help prevent HIV infection. If you choose to take medicine to prevent HIV, you should first get tested for HIV. You should then be tested every 3 months for as long as you are taking the medicine. Pregnancy If you are about to stop having your period (premenopausal) and you may become pregnant, seek counseling before you get pregnant. Take 400 to 800 micrograms (mcg) of folic acid every day if you become pregnant. Ask for birth control (contraception) if you want to prevent pregnancy. Osteoporosis and menopause Osteoporosis is a disease in which the bones lose minerals and strength with aging. This can result in bone fractures. If you are 81 years old or older, or if you are at risk for osteoporosis and fractures, ask your health care provider if you should: Be screened for bone loss. Take a calcium or vitamin D supplement to lower your risk of fractures. Be given hormone replacement therapy (HRT) to treat symptoms of menopause. Follow these instructions at home: Lifestyle Do not use any products that contain nicotine or tobacco, such as cigarettes, e-cigarettes, and chewing tobacco. If you need help quitting, ask your health care provider. Do not use street drugs. Do not share needles. Ask your health care provider for help if you need support or information about quitting drugs. Alcohol use Do not drink alcohol if: Your health care provider tells you not to drink. You are pregnant, may be pregnant, or are planning to become pregnant. If you drink alcohol: Limit how much you use to 0-1 drink a day. Limit intake if you are breastfeeding. Be aware of how much alcohol is in your drink. In the [place] one drink equals one 12 oz bottle of beer (355 mL), one 5 oz glass of wine (148 mL), or one 1 oz glass of hard liquor (44 mL). General instructions Schedule regular health, dental, and eye exams. Stay current with your vaccines. Tell your health  care provider if: You often feel depressed. You have ever been abused or do not feel safe at home. Summary Adopting a healthy lifestyle and getting preventive care are important in promoting health and wellness. Follow your health care provider's instructions about healthy diet, exercising, and getting tested or screened for diseases. Follow your health care provider's instructions on monitoring your cholesterol and blood pressure. This information is not intended to replace advice given to you by your health care provider. Make sure you discuss any questions you have with your healthcare provider. Document Revised: 07/12/2018 Document Reviewed: 07/12/2018 Elsevier Patient Education  2022 [employer].

## 2021-03-12 NOTE — Progress Notes (Signed)
Subjective:    Patient ID: Gail Alvarez, female    DOB: [birth year], 69 y.o.   MRN: [id number]   This visit occurred during the SARS-CoV-2 public health emergency.  Safety protocols were in place, including screening questions prior to the visit, additional usage of staff PPE, and extensive cleaning of exam room while observing appropriate contact time as indicated for disinfecting solutions.    HPI She is here for a physical exam.     Medications and allergies reviewed with patient and updated if appropriate.  Patient Active Problem List   Diagnosis Date Noted  . Sensation of pressure in bladder area 08/05/2020  . High vitamin D level 08/05/2020  . Hair thinning 08/04/2018  . Arthralgia 03/08/2017  . Plantar fasciitis of right foot 08/12/2015  . Osteopenia, high frax 02/13/2014  . Polymyalgia rheumatica ([place]) 10/15/2013  . Acne erythematosa 10/15/2013    Current Outpatient Medications on File Prior to Visit  Medication Sig Dispense Refill  . B Complex-Biotin-FA (B COMPLETE PO) Take by mouth daily.    . Cholecalciferol (VITAMIN D-3) 1000 units CAPS Take 5,000 Units by mouth daily.     . Magnesium 200 MG TABS Take by mouth daily.    . metroNIDAZOLE (METROCREAM) 0.75 % cream APPLY EXTERNALLY TO THE AFFECTED AREA TWICE DAILY 45 g 5  . TURMERIC PO Take by mouth daily.    . Turmeric POWD Take by mouth.    Marland Kitchen UNABLE TO FIND daily. Alive Calcium, K2, Mag, D3, Buron, & Strontion    . ZINC SULFATE PO Take by mouth.     No current facility-administered medications on file prior to visit.    Past Medical History:  Diagnosis Date  . Depression   . Thyroid disease     Past Surgical History:  Procedure Laterality Date  . ABDOMINAL HYSTERECTOMY     ovaries left  . BREAST BIOPSY  [phone]    Social History   Socioeconomic History  . Marital status: Single    Spouse name: Not on file  . Number of children: Not on file  . Years of education: Not on file  . Highest  education level: Not on file  Occupational History  . Not on file  Tobacco Use  . Smoking status: Former  . Smokeless tobacco: Never  Vaping Use  . Vaping Use: Never used  Substance and Sexual Activity  . Alcohol use: Yes    Comment: social  . Drug use: No  . Sexual activity: Not Currently  Other Topics Concern  . Not on file  Social History Narrative   Exercise:  No regular exercise   Social Determinants of Health   Financial Resource Strain: Not on file  Food Insecurity: Not on file  Transportation Needs: Not on file  Physical Activity: Not on file  Stress: Not on file  Social Connections: Not on file    Family History  Problem Relation Age of Onset  . Cancer Mother        Cervical & pancreatic  . Hyperlipidemia Mother   . Alzheimer's disease Father   . Alcohol abuse Maternal Grandmother   . Arthritis Maternal Grandmother   . Heart disease Maternal Grandmother   . Alcohol abuse Maternal Grandfather   . Heart disease Maternal Grandfather   . Arthritis Paternal Grandmother     Review of Systems     Objective:  There were no vitals filed for this visit. There were no vitals filed for this visit. There is  no height or weight on file to calculate BMI.  BP Readings from Last 3 Encounters:  04/13/21 116/74  08/05/20 126/72  11/01/19 (!) 144/80    Wt Readings from Last 3 Encounters:  04/13/21 143 lb 3.2 oz (65 kg)  08/05/20 151 lb 3.2 oz (68.6 kg)  08/04/18 152 lb (68.9 kg)     Physical Exam Constitutional: She appears well-developed and well-nourished. No distress.  HENT:  Head: Normocephalic and atraumatic.  Right Ear: External ear normal. Normal ear canal and TM Left Ear: External ear normal.  Normal ear canal and TM Mouth/Throat: Oropharynx is clear and moist.  Eyes: Conjunctivae and EOM are normal.  Neck: Neck supple. No tracheal deviation present. No thyromegaly present.  No carotid bruit  Cardiovascular: Normal rate, regular rhythm and normal  heart sounds.   No murmur heard.  No edema. Pulmonary/Chest: Effort normal and breath sounds normal. No respiratory distress. She has no wheezes. She has no rales.  Breast: deferred   Abdominal: Soft. She exhibits no distension. There is no tenderness.  Lymphadenopathy: She has no cervical adenopathy.  Skin: Skin is warm and dry. She is not diaphoretic.  Psychiatric: She has a normal mood and affect. Her behavior is normal.     Lab Results  Component Value Date   WBC 8.0 04/13/2021   HGB 14.6 04/13/2021   HCT 43.5 04/13/2021   PLT 257.0 04/13/2021   GLUCOSE 104 (H) 04/13/2021   CHOL 213 (H) 04/13/2021   TRIG 191.0 (H) 04/13/2021   HDL 44.40 04/13/2021   LDLDIRECT 112.0 04/23/2016   LDLCALC 130 (H) 04/13/2021   ALT 19 04/13/2021   AST 20 04/13/2021   NA 139 04/13/2021   K 3.9 04/13/2021   CL 103 04/13/2021   CREATININE 0.84 04/13/2021   BUN 14 04/13/2021   CO2 28 04/13/2021   TSH 1.73 04/13/2021         Assessment & Plan:   Physical exam: Screening blood work  ordered Exercise   Weight   Substance abuse  none   Reviewed recommended immunizations.   Health Maintenance  Topic Date Due  . COVID-19 Vaccine (1) Never done  . Pneumonia Vaccine 19+ Years old (1 - PCV) Never done  . Zoster Vaccines- Shingrix (1 of 2) 07/13/2021 (Originally 10/03/1998)  . INFLUENZA VACCINE  10/30/2021 (Originally 03/02/2021)  . TETANUS/TDAP  04/13/2022 (Originally 10/03/1967)  . DEXA SCAN  11/12/2021  . MAMMOGRAM  02/11/2023  . COLONOSCOPY (Pts 45-64yr Insurance coverage will need to be confirmed)  03/13/2024  . Hepatitis C Screening  Completed  . HPV VACCINES  Aged Out          See Problem List for Assessment and Plan of chronic medical problems.      This encounter was created in error - please disregard.

## 2021-03-13 ENCOUNTER — Encounter: Payer: PPO | Admitting: Internal Medicine

## 2021-03-13 DIAGNOSIS — Z0000 Encounter for general adult medical examination without abnormal findings: Secondary | ICD-10-CM

## 2021-03-13 DIAGNOSIS — M85851 Other specified disorders of bone density and structure, right thigh: Secondary | ICD-10-CM

## 2021-04-12 NOTE — Progress Notes (Signed)
Subjective:    Patient ID: Gail Alvarez, female    DOB: 02-18-1951, 70 y.o.   MRN: [id number]   This visit occurred during the SARS-CoV-2 public health emergency.  Safety protocols were in place, including screening questions prior to the visit, additional usage of staff PPE, and extensive cleaning of exam room while observing appropriate contact time as indicated for disinfecting solutions.    HPI She is here for a physical exam.    She denies concerns.     She gets warm flushes in her face.  She denies other symptoms.    She continues to have hair loss.      Medications and allergies reviewed with patient and updated if appropriate.  Patient Active Problem List   Diagnosis Date Noted   Sensation of pressure in bladder area 08/05/2020   High vitamin D level 08/05/2020   Hair thinning 08/04/2018   Arthralgia 03/08/2017   Plantar fasciitis of right foot 08/12/2015   Osteopenia, high frax 02/13/2014   Polymyalgia rheumatica ([place]) 10/15/2013   Acne erythematosa 10/15/2013    Current Outpatient Medications on File Prior to Visit  Medication Sig Dispense Refill   B Complex-Biotin-FA (B COMPLETE PO) Take by mouth daily.     Cholecalciferol (VITAMIN D-3) 1000 units CAPS Take 5,000 Units by mouth daily.      COD LIVER OIL PO Take by mouth.     Magnesium 200 MG TABS Take by mouth daily.     MELATONIN PO Take 3 mg by mouth at bedtime.     metroNIDAZOLE (METROCREAM) 0.75 % cream APPLY EXTERNALLY TO THE AFFECTED AREA TWICE DAILY 45 g 5   TURMERIC PO Take by mouth daily.     Turmeric POWD Take by mouth.     UNABLE TO FIND daily. Alive Calcium, K2, Mag, D3, Buron, & Strontion     ZINC SULFATE PO Take by mouth.     No current facility-administered medications on file prior to visit.    Past Medical History:  Diagnosis Date   Depression    Thyroid disease     Past Surgical History:  Procedure Laterality Date   ABDOMINAL HYSTERECTOMY     ovaries left   BREAST BIOPSY   [phone]    Social History   Socioeconomic History   Marital status: Single    Spouse name: Not on file   Number of children: Not on file   Years of education: Not on file   Highest education level: Not on file  Occupational History   Not on file  Tobacco Use   Smoking status: Former   Smokeless tobacco: Never  Vaping Use   Vaping Use: Never used  Substance and Sexual Activity   Alcohol use: Yes    Comment: social   Drug use: No   Sexual activity: Not Currently  Other Topics Concern   Not on file  Social History Narrative   Exercise:  No regular exercise   Social Determinants of Health   Financial Resource Strain: Not on file  Food Insecurity: Not on file  Transportation Needs: Not on file  Physical Activity: Not on file  Stress: Not on file  Social Connections: Not on file    Family History  Problem Relation Age of Onset   Cancer Mother        Cervical & pancreatic   Hyperlipidemia Mother    Alzheimer's disease Father    Alcohol abuse Maternal Grandmother    Arthritis Maternal Grandmother  Heart disease Maternal Grandmother    Alcohol abuse Maternal Grandfather    Heart disease Maternal Grandfather    Arthritis Paternal Grandmother     Review of Systems  Constitutional:  Negative for chills and fever.  Eyes:  Negative for visual disturbance.  Respiratory:  Negative for cough, shortness of breath and wheezing.   Cardiovascular:  Negative for chest pain, palpitations and leg swelling.  Gastrointestinal:  Positive for nausea (with vertigo). Negative for abdominal pain, blood in stool, constipation and diarrhea.       No gerd  Genitourinary:  Negative for dysuria.  Musculoskeletal:  Positive for back pain (right lower back). Negative for arthralgias.  Skin:  Negative for rash.  Neurological:  Positive for dizziness (rare) and headaches (occ).  Psychiatric/Behavioral:  Negative for dysphoric mood. The patient is not nervous/anxious.        Objective:   Vitals:   04/13/21 1502  BP: 116/74  Pulse: 70  Temp: 98 F (36.7 C)  SpO2: 97%   Filed Weights   04/13/21 1502  Weight: 143 lb 3.2 oz (65 kg)   Body mass index is 23.11 kg/m.  BP Readings from Last 3 Encounters:  04/13/21 116/74  08/05/20 126/72  11/01/19 (!) 144/80    Wt Readings from Last 3 Encounters:  04/13/21 143 lb 3.2 oz (65 kg)  08/05/20 151 lb 3.2 oz (68.6 kg)  08/04/18 152 lb (68.9 kg)     Physical Exam Constitutional: She appears well-developed and well-nourished. No distress.  HENT:  Head: Normocephalic and atraumatic.  Right Ear: External ear normal. Normal ear canal and TM Left Ear: External ear normal.  Normal ear canal and TM Mouth/Throat: Oropharynx is clear and moist.  Eyes: Conjunctivae and EOM are normal.  Neck: Neck supple. No tracheal deviation present. No thyromegaly present.  No carotid bruit  Cardiovascular: Normal rate, regular rhythm and normal heart sounds.   No murmur heard.  No edema. Pulmonary/Chest: Effort normal and breath sounds normal. No respiratory distress. She has no wheezes. She has no rales.  Breast: deferred   Abdominal: Soft. She exhibits no distension. There is no tenderness.  Lymphadenopathy: She has no cervical adenopathy.  Skin: Skin is warm and dry. She is not diaphoretic.  Psychiatric: She has a normal mood and affect. Her behavior is normal.     Lab Results  Component Value Date   WBC 7.7 08/04/2018   HGB 14.7 08/04/2018   HCT 43.8 08/04/2018   PLT 278.0 08/04/2018   GLUCOSE 97 08/04/2018   CHOL 198 04/23/2016   TRIG 221.0 (H) 04/23/2016   HDL 34.60 (L) 04/23/2016   LDLDIRECT 112.0 04/23/2016   ALT 18 08/04/2018   AST 17 08/04/2018   NA 140 08/04/2018   K 4.1 08/04/2018   CL 105 08/04/2018   CREATININE 0.78 08/04/2018   BUN 16 08/04/2018   CO2 27 08/04/2018   TSH 1.72 08/04/2018         Assessment & Plan:   Physical exam: Screening blood work  ordered Exercise     regular Weight    normal Substance abuse  none   Reviewed recommended immunizations.  Deferred all   Health Maintenance  Topic Date Due   COVID-19 Vaccine (1) 04/29/2021 (Originally 04/04/1951)   Zoster Vaccines- Shingrix (1 of 2) 07/13/2021 (Originally 10/03/1998)   PNA vac Low Risk Adult (1 of 2 - PCV13) 10/24/2021 (Originally 10/02/2013)   INFLUENZA VACCINE  10/30/2021 (Originally 03/02/2021)   TETANUS/TDAP  04/13/2022 (Originally 10/03/1967)  DEXA SCAN  11/12/2021   MAMMOGRAM  02/11/2023   COLONOSCOPY (Pts 45-9yr Insurance coverage will need to be confirmed)  03/13/2024   Hepatitis C Screening  Completed   HPV VACCINES  Aged Out          See Problem List for Assessment and Plan of chronic medical problems.

## 2021-04-12 NOTE — Patient Instructions (Addendum)
Blood work was ordered.     Medications changes include :   none    Please followup in 1 year     Health Maintenance, Female Adopting a healthy lifestyle and getting preventive care are important in promoting health and wellness. Ask your health care provider about: The right schedule for you to have regular tests and exams. Things you can do on your own to prevent diseases and keep yourself healthy. What should I know about diet, weight, and exercise? Eat a healthy diet  Eat a diet that includes plenty of vegetables, fruits, low-fat dairy products, and lean protein. Do not eat a lot of foods that are high in solid fats, added sugars, or sodium. Maintain a healthy weight Body mass index (BMI) is used to identify weight problems. It estimates body fat based on height and weight. Your health care provider can help determine your BMI and help you achieve or maintain a healthy weight. Get regular exercise Get regular exercise. This is one of the most important things you can do for your health. Most adults should: Exercise for at least 150 minutes each week. The exercise should increase your heart rate and make you sweat (moderate-intensity exercise). Do strengthening exercises at least twice a week. This is in addition to the moderate-intensity exercise. Spend less time sitting. Even light physical activity can be beneficial. Watch cholesterol and blood lipids Have your blood tested for lipids and cholesterol at 72 years of age, then have this test every 5 years. Have your cholesterol levels checked more often if: Your lipid or cholesterol levels are high. You are older than 72 years of age. You are at high risk for heart disease. What should I know about cancer screening? Depending on your health history and family history, you may need to have cancer screening at various ages. This may include screening for: Breast cancer. Cervical cancer. Colorectal cancer. Skin cancer. Lung  cancer. What should I know about heart disease, diabetes, and high blood pressure? Blood pressure and heart disease High blood pressure causes heart disease and increases the risk of stroke. This is more likely to develop in people who have high blood pressure readings, are of African descent, or are overweight. Have your blood pressure checked: Every 3-5 years if you are 53-43 years of age. Every year if you are 46 years old or older. Diabetes Have regular diabetes screenings. This checks your fasting blood sugar level. Have the screening done: Once every three years after age 75 if you are at a normal weight and have a low risk for diabetes. More often and at a younger age if you are overweight or have a high risk for diabetes. What should I know about preventing infection? Hepatitis B If you have a higher risk for hepatitis B, you should be screened for this virus. Talk with your health care provider to find out if you are at risk for hepatitis B infection. Hepatitis C Testing is recommended for: Everyone born from [birth year] through 1965. Anyone with known risk factors for hepatitis C. Sexually transmitted infections (STIs) Get screened for STIs, including gonorrhea and chlamydia, if: You are sexually active and are younger than 72 years of age. You are older than 72 years of age and your health care provider tells you that you are at risk for this type of infection. Your sexual activity has changed since you were last screened, and you are at increased risk for chlamydia or gonorrhea. Ask your health care provider if  you are at risk. Ask your health care provider about whether you are at high risk for HIV. Your health care provider may recommend a prescription medicine to help prevent HIV infection. If you choose to take medicine to prevent HIV, you should first get tested for HIV. You should then be tested every 3 months for as long as you are taking the medicine. Pregnancy If you are about  to stop having your period (premenopausal) and you may become pregnant, seek counseling before you get pregnant. Take 400 to 800 micrograms (mcg) of folic acid every day if you become pregnant. Ask for birth control (contraception) if you want to prevent pregnancy. Osteoporosis and menopause Osteoporosis is a disease in which the bones lose minerals and strength with aging. This can result in bone fractures. If you are 43 years old or older, or if you are at risk for osteoporosis and fractures, ask your health care provider if you should: Be screened for bone loss. Take a calcium or vitamin D supplement to lower your risk of fractures. Be given hormone replacement therapy (HRT) to treat symptoms of menopause. Follow these instructions at home: Lifestyle Do not use any products that contain nicotine or tobacco, such as cigarettes, e-cigarettes, and chewing tobacco. If you need help quitting, ask your health care provider. Do not use street drugs. Do not share needles. Ask your health care provider for help if you need support or information about quitting drugs. Alcohol use Do not drink alcohol if: Your health care provider tells you not to drink. You are pregnant, may be pregnant, or are planning to become pregnant. If you drink alcohol: Limit how much you use to 0-1 drink a day. Limit intake if you are breastfeeding. Be aware of how much alcohol is in your drink. In the [place] one drink equals one 12 oz bottle of beer (355 mL), one 5 oz glass of wine (148 mL), or one 1 oz glass of hard liquor (44 mL). General instructions Schedule regular health, dental, and eye exams. Stay current with your vaccines. Tell your health care provider if: You often feel depressed. You have ever been abused or do not feel safe at home. Summary Adopting a healthy lifestyle and getting preventive care are important in promoting health and wellness. Follow your health care provider's instructions about  healthy diet, exercising, and getting tested or screened for diseases. Follow your health care provider's instructions on monitoring your cholesterol and blood pressure. This information is not intended to replace advice given to you by your health care provider. Make sure you discuss any questions you have with your health care provider. Document Revised: 09/26/2020 Document Reviewed: 07/12/2018 Elsevier Patient Education  2022 [employer].

## 2021-04-13 ENCOUNTER — Encounter: Payer: Self-pay | Admitting: Internal Medicine

## 2021-04-13 ENCOUNTER — Ambulatory Visit (INDEPENDENT_AMBULATORY_CARE_PROVIDER_SITE_OTHER): Payer: PPO | Admitting: Internal Medicine

## 2021-04-13 ENCOUNTER — Other Ambulatory Visit: Payer: Self-pay

## 2021-04-13 VITALS — BP 116/74 | HR 70 | Temp 98.0°F | Ht 66.0 in | Wt 143.2 lb

## 2021-04-13 DIAGNOSIS — E673 Hypervitaminosis D: Secondary | ICD-10-CM | POA: Diagnosis not present

## 2021-04-13 DIAGNOSIS — Z0000 Encounter for general adult medical examination without abnormal findings: Secondary | ICD-10-CM | POA: Diagnosis not present

## 2021-04-13 DIAGNOSIS — M85852 Other specified disorders of bone density and structure, left thigh: Secondary | ICD-10-CM | POA: Diagnosis not present

## 2021-04-13 DIAGNOSIS — L659 Nonscarring hair loss, unspecified: Secondary | ICD-10-CM | POA: Diagnosis not present

## 2021-04-13 DIAGNOSIS — M85851 Other specified disorders of bone density and structure, right thigh: Secondary | ICD-10-CM | POA: Diagnosis not present

## 2021-04-13 LAB — TSH: TSH: 1.73 u[IU]/mL (ref 0.35–5.50)

## 2021-04-13 LAB — COMPREHENSIVE METABOLIC PANEL
ALT: 19 U/L (ref 0–35)
AST: 20 U/L (ref 0–37)
Albumin: 4.5 g/dL (ref 3.5–5.2)
Alkaline Phosphatase: 78 U/L (ref 39–117)
BUN: 14 mg/dL (ref 6–23)
CO2: 28 mEq/L (ref 19–32)
Calcium: 10.3 mg/dL (ref 8.4–10.5)
Chloride: 103 mEq/L (ref 96–112)
Creatinine, Ser: 0.84 mg/dL (ref 0.40–1.20)
GFR: 69.37 mL/min (ref >60.00)
Glucose, Bld: 104 mg/dL — ABNORMAL HIGH (ref 70–99)
Potassium: 3.9 mEq/L (ref 3.5–5.1)
Sodium: 139 mEq/L (ref 135–145)
Total Bilirubin: 0.5 mg/dL (ref 0.2–1.2)
Total Protein: 7.6 g/dL (ref 6.0–8.3)

## 2021-04-13 LAB — LIPID PANEL
Cholesterol: 213 mg/dL — ABNORMAL HIGH (ref 0–200)
HDL: 44.4 mg/dL (ref >39.00)
LDL Cholesterol: 130 mg/dL — ABNORMAL HIGH (ref 0–99)
NonHDL: 168.6
Total CHOL/HDL Ratio: 5
Triglycerides: 191 mg/dL — ABNORMAL HIGH (ref 0.0–149.0)
VLDL: 38.2 mg/dL (ref 0.0–40.0)

## 2021-04-13 LAB — CBC WITH DIFFERENTIAL/PLATELET
Basophils Absolute: 0 10*3/uL (ref 0.0–0.1)
Basophils Relative: 0.6 % (ref 0.0–3.0)
Eosinophils Absolute: 0.3 10*3/uL (ref 0.0–0.7)
Eosinophils Relative: 4 % (ref 0.0–5.0)
HCT: 43.5 % (ref 36.0–46.0)
Hemoglobin: 14.6 g/dL (ref 12.0–15.0)
Lymphocytes Relative: 34.5 % (ref 12.0–46.0)
Lymphs Abs: 2.8 10*3/uL (ref 0.7–4.0)
MCHC: 33.6 g/dL (ref 30.0–36.0)
MCV: 90.8 fl (ref 78.0–100.0)
Monocytes Absolute: 0.5 10*3/uL (ref 0.1–1.0)
Monocytes Relative: 6.3 % (ref 3.0–12.0)
Neutro Abs: 4.4 10*3/uL (ref 1.4–7.7)
Neutrophils Relative %: 54.6 % (ref 43.0–77.0)
Platelets: 257 10*3/uL (ref 150.0–400.0)
RBC: 4.79 Mil/uL (ref 3.87–5.11)
RDW: 13.2 % (ref 11.5–15.5)
WBC: 8 10*3/uL (ref 4.0–10.5)

## 2021-04-13 LAB — VITAMIN D 25 HYDROXY (VIT D DEFICIENCY, FRACTURES): VITD: 75.99 ng/mL (ref 30.00–100.00)

## 2021-04-13 NOTE — Assessment & Plan Note (Addendum)
Chronic Was going to see derm ? Hormonal Unlikely vitamin related - she takes plenty of supplements

## 2021-04-13 NOTE — Assessment & Plan Note (Signed)
Chronic dexa up to date Taking vitamin d Exercising regularly

## 2021-04-13 NOTE — Assessment & Plan Note (Signed)
Chronic Taking vitamin D daily Check vitamin D level  

## 2021-04-13 NOTE — Addendum Note (Signed)
Addended by: Trenda Moots on: [id number] 03:57 PM   Modules accepted: Orders

## 2021-08-11 DIAGNOSIS — Z961 Presence of intraocular lens: Secondary | ICD-10-CM | POA: Diagnosis not present

## 2021-08-11 DIAGNOSIS — Z01818 Encounter for other preprocedural examination: Secondary | ICD-10-CM | POA: Diagnosis not present

## 2021-08-11 DIAGNOSIS — H25811 Combined forms of age-related cataract, right eye: Secondary | ICD-10-CM | POA: Diagnosis not present

## 2021-08-28 DIAGNOSIS — H2511 Age-related nuclear cataract, right eye: Secondary | ICD-10-CM | POA: Diagnosis not present

## 2021-08-28 DIAGNOSIS — H25811 Combined forms of age-related cataract, right eye: Secondary | ICD-10-CM | POA: Diagnosis not present

## 2022-09-08 DIAGNOSIS — Z1231 Encounter for screening mammogram for malignant neoplasm of breast: Secondary | ICD-10-CM | POA: Diagnosis not present

## 2022-09-08 DIAGNOSIS — M810 Age-related osteoporosis without current pathological fracture: Secondary | ICD-10-CM | POA: Diagnosis not present

## 2022-09-08 DIAGNOSIS — Z780 Asymptomatic menopausal state: Secondary | ICD-10-CM | POA: Diagnosis not present

## 2022-09-08 DIAGNOSIS — M8589 Other specified disorders of bone density and structure, multiple sites: Secondary | ICD-10-CM | POA: Diagnosis not present

## 2022-09-08 LAB — HM DEXA SCAN

## 2022-09-08 LAB — HM MAMMOGRAPHY

## 2022-09-14 DIAGNOSIS — R928 Other abnormal and inconclusive findings on diagnostic imaging of breast: Secondary | ICD-10-CM | POA: Diagnosis not present

## 2022-09-14 LAB — HM MAMMOGRAPHY

## 2022-09-21 ENCOUNTER — Encounter: Payer: Self-pay | Admitting: Internal Medicine

## 2022-09-21 DIAGNOSIS — E781 Pure hyperglyceridemia: Secondary | ICD-10-CM | POA: Insufficient documentation

## 2022-09-21 DIAGNOSIS — E785 Hyperlipidemia, unspecified: Secondary | ICD-10-CM | POA: Insufficient documentation

## 2022-09-21 NOTE — Progress Notes (Unsigned)
Subjective:    Patient ID: Gail Alvarez, female    DOB: 07/16/1951, 72 y.o.   MRN: [id number]      HPI Gail Alvarez is here for a Physical exam.   Had mammogram done earlier this month and needed additional imaging.  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  FRAX 17%, 5.5%   Pain anal region - x months - pain is intermittent. Sometimes hurts when sitting.  No pain with BM;s.    She is going to see the gynecologist this year.    Medications and allergies reviewed with patient and updated if appropriate.  Current Outpatient Medications on File Prior to Visit  Medication Sig Dispense Refill   B Complex-Biotin-FA (B COMPLETE PO) Take by mouth daily.     Cholecalciferol (VITAMIN D-3) 1000 units CAPS Take 5,000 Units by mouth daily.      COD LIVER OIL PO Take by mouth.     Magnesium 200 MG TABS Take by mouth daily.     MELATONIN PO Take 3 mg by mouth at bedtime.     metroNIDAZOLE (METROCREAM) 0.75 % cream APPLY EXTERNALLY TO THE AFFECTED AREA TWICE DAILY 45 g 5   TURMERIC PO Take by mouth daily.     Turmeric POWD Take by mouth.     UNABLE TO FIND daily. Alive Calcium, K2, Mag, D3, Buron, & Strontion     ZINC SULFATE PO Take by mouth.     No current facility-administered medications on file prior to visit.    Review of Systems  Constitutional:  Negative for fever.  Eyes:  Negative for visual disturbance.  Respiratory:  Negative for cough, shortness of breath and wheezing.   Cardiovascular:  Negative for chest pain, palpitations and leg swelling.  Gastrointestinal:  Negative for abdominal pain, blood in stool, constipation and diarrhea.       No gerd  Genitourinary:  Negative for dysuria.  Musculoskeletal:  Negative for arthralgias and back pain.  Skin:  Negative for rash.  Neurological:  Negative for light-headedness and headaches.  Psychiatric/Behavioral:  Negative for dysphoric  mood. The patient is not nervous/anxious.        Objective:   Vitals:   09/22/22 1352  BP: 128/80  Pulse: 65  Temp: 97.6 F (36.4 C)  SpO2: 99%   Filed Weights   09/22/22 1352  Weight: 144 lb (65.3 kg)   Body mass index is 23.24 kg/m.  BP Readings from Last 3 Encounters:  09/22/22 128/80  04/13/21 116/74  08/05/20 126/72    Wt Readings from Last 3 Encounters:  09/22/22 144 lb (65.3 kg)  04/13/21 143 lb 3.2 oz (65 kg)  08/05/20 151 lb 3.2 oz (68.6 kg)       Physical Exam Constitutional: She appears well-developed and well-nourished. No distress.  HENT:  Head: Normocephalic and atraumatic.  Right Ear: External ear normal. Normal ear canal and TM Left Ear: External ear normal.  Normal ear canal and TM Mouth/Throat: Oropharynx is clear and moist.  Eyes: Conjunctivae normal.  Neck: Neck supple. No tracheal deviation present. No thyromegaly present.  No carotid bruit  Cardiovascular: Normal rate, regular rhythm and normal heart sounds.   No murmur heard.  No edema. Pulmonary/Chest: Effort normal and breath sounds normal. No respiratory distress. She has no wheezes. She has no rales.  Breast: deferred   Abdominal: Soft. She exhibits no distension. There is no tenderness.  GU:  pt deferred Lymphadenopathy: She has no cervical adenopathy.  Skin: Skin is warm and dry. She is not diaphoretic.  Psychiatric: She has a normal mood and affect. Her behavior is normal.     Lab Results  Component Value Date   WBC 8.0 04/13/2021   HGB 14.6 04/13/2021   HCT 43.5 04/13/2021   PLT 257.0 04/13/2021   GLUCOSE 104 (H) 04/13/2021   CHOL 213 (H) 04/13/2021   TRIG 191.0 (H) 04/13/2021   HDL 44.40 04/13/2021   LDLDIRECT 112.0 04/23/2016   LDLCALC 130 (H) 04/13/2021   ALT 19 04/13/2021   AST 20 04/13/2021   NA 139 04/13/2021   K 3.9 04/13/2021   CL 103 04/13/2021   CREATININE 0.84 04/13/2021   BUN 14 04/13/2021   CO2 28 04/13/2021   TSH 1.73 04/13/2021          Assessment & Plan:   Physical exam: Screening blood work  ordered Exercise  yoga, some walking, some weights  Weight normal Substance abuse  none   Reviewed recommended immunizations.  Deferred.    Health Maintenance  Topic Date Due   Medicare Annual Wellness (AWV)  10/25/2018   COVID-19 Vaccine (1) 10/08/2022 (Originally 04/04/1951)   INFLUENZA VACCINE  10/31/2022 (Originally 03/02/2022)   Zoster Vaccines- Shingrix (1 of 2) 12/21/2022 (Originally 10/03/1998)   Pneumonia Vaccine 23+ Years old (1 of 1 - PCV) 09/23/2023 (Originally 10/02/2013)   MAMMOGRAM  02/11/2023   COLONOSCOPY (Pts 45-73yr Insurance coverage will need to be confirmed)  03/13/2024   DEXA SCAN  09/08/2024   Hepatitis C Screening  Completed   HPV VACCINES  Aged Out   DTaP/Tdap/Td  Discontinued     Will see gyn this year.     See Problem List for Assessment and Plan of chronic medical problems.

## 2022-09-21 NOTE — Patient Instructions (Addendum)
Blood work was ordered.   The lab is on the first floor.    Medications changes include :   None      Return in about 1 year (around 09/23/2023) for Physical Exam.   Health Maintenance, Female Adopting a healthy lifestyle and getting preventive care are important in promoting health and wellness. Ask your health care provider about: The right schedule for you to have regular tests and exams. Things you can do on your own to prevent diseases and keep yourself healthy. What should I know about diet, weight, and exercise? Eat a healthy diet  Eat a diet that includes plenty of vegetables, fruits, low-fat dairy products, and lean protein. Do not eat a lot of foods that are high in solid fats, added sugars, or sodium. Maintain a healthy weight Body mass index (BMI) is used to identify weight problems. It estimates body fat based on height and weight. Your health care provider can help determine your BMI and help you achieve or maintain a healthy weight. Get regular exercise Get regular exercise. This is one of the most important things you can do for your health. Most adults should: Exercise for at least 150 minutes each week. The exercise should increase your heart rate and make you sweat (moderate-intensity exercise). Do strengthening exercises at least twice a week. This is in addition to the moderate-intensity exercise. Spend less time sitting. Even light physical activity can be beneficial. Watch cholesterol and blood lipids Have your blood tested for lipids and cholesterol at 74 years of age, then have this test every 5 years. Have your cholesterol levels checked more often if: Your lipid or cholesterol levels are high. You are older than 74 years of age. You are at high risk for heart disease. What should I know about cancer screening? Depending on your health history and family history, you may need to have cancer screening at various ages. This may include screening  for: Breast cancer. Cervical cancer. Colorectal cancer. Skin cancer. Lung cancer. What should I know about heart disease, diabetes, and high blood pressure? Blood pressure and heart disease High blood pressure causes heart disease and increases the risk of stroke. This is more likely to develop in people who have high blood pressure readings or are overweight. Have your blood pressure checked: Every 3-5 years if you are 13-20 years of age. Every year if you are 17 years old or older. Diabetes Have regular diabetes screenings. This checks your fasting blood sugar level. Have the screening done: Once every three years after age 35 if you are at a normal weight and have a low risk for diabetes. More often and at a younger age if you are overweight or have a high risk for diabetes. What should I know about preventing infection? Hepatitis B If you have a higher risk for hepatitis B, you should be screened for this virus. Talk with your health care provider to find out if you are at risk for hepatitis B infection. Hepatitis C Testing is recommended for: Everyone born from [birth year] through 1965. Anyone with known risk factors for hepatitis C. Sexually transmitted infections (STIs) Get screened for STIs, including gonorrhea and chlamydia, if: You are sexually active and are younger than 74 years of age. You are older than 74 years of age and your health care provider tells you that you are at risk for this type of infection. Your sexual activity has changed since you were last screened, and you are  at increased risk for chlamydia or gonorrhea. Ask your health care provider if you are at risk. Ask your health care provider about whether you are at high risk for HIV. Your health care provider may recommend a prescription medicine to help prevent HIV infection. If you choose to take medicine to prevent HIV, you should first get tested for HIV. You should then be tested every 3 months for as long as you  are taking the medicine. Pregnancy If you are about to stop having your period (premenopausal) and you may become pregnant, seek counseling before you get pregnant. Take 400 to 800 micrograms (mcg) of folic acid every day if you become pregnant. Ask for birth control (contraception) if you want to prevent pregnancy. Osteoporosis and menopause Osteoporosis is a disease in which the bones lose minerals and strength with aging. This can result in bone fractures. If you are 18 years old or older, or if you are at risk for osteoporosis and fractures, ask your health care provider if you should: Be screened for bone loss. Take a calcium or vitamin D supplement to lower your risk of fractures. Be given hormone replacement therapy (HRT) to treat symptoms of menopause. Follow these instructions at home: Alcohol use Do not drink alcohol if: Your health care provider tells you not to drink. You are pregnant, may be pregnant, or are planning to become pregnant. If you drink alcohol: Limit how much you have to: 0-1 drink a day. Know how much alcohol is in your drink. In the [place] one drink equals one 12 oz bottle of beer (355 mL), one 5 oz glass of wine (148 mL), or one 1 oz glass of hard liquor (44 mL). Lifestyle Do not use any products that contain nicotine or tobacco. These products include cigarettes, chewing tobacco, and vaping devices, such as e-cigarettes. If you need help quitting, ask your health care provider. Do not use street drugs. Do not share needles. Ask your health care provider for help if you need support or information about quitting drugs. General instructions Schedule regular health, dental, and eye exams. Stay current with your vaccines. Tell your health care provider if: You often feel depressed. You have ever been abused or do not feel safe at home. Summary Adopting a healthy lifestyle and getting preventive care are important in promoting health and wellness. Follow your  health care provider's instructions about healthy diet, exercising, and getting tested or screened for diseases. Follow your health care provider's instructions on monitoring your cholesterol and blood pressure. This information is not intended to replace advice given to you by your health care provider. Make sure you discuss any questions you have with your health care provider. Document Revised: 12/08/2020 Document Reviewed: 12/08/2020 Elsevier Patient Education  [employer].

## 2022-09-22 ENCOUNTER — Ambulatory Visit (INDEPENDENT_AMBULATORY_CARE_PROVIDER_SITE_OTHER): Payer: 59 | Admitting: Internal Medicine

## 2022-09-22 ENCOUNTER — Encounter: Payer: Self-pay | Admitting: Internal Medicine

## 2022-09-22 VITALS — BP 128/80 | HR 65 | Temp 97.6°F | Ht 66.0 in | Wt 144.0 lb

## 2022-09-22 DIAGNOSIS — M85852 Other specified disorders of bone density and structure, left thigh: Secondary | ICD-10-CM

## 2022-09-22 DIAGNOSIS — M353 Polymyalgia rheumatica: Secondary | ICD-10-CM | POA: Diagnosis not present

## 2022-09-22 DIAGNOSIS — R739 Hyperglycemia, unspecified: Secondary | ICD-10-CM | POA: Diagnosis not present

## 2022-09-22 DIAGNOSIS — Z0000 Encounter for general adult medical examination without abnormal findings: Secondary | ICD-10-CM

## 2022-09-22 DIAGNOSIS — M85851 Other specified disorders of bone density and structure, right thigh: Secondary | ICD-10-CM

## 2022-09-22 DIAGNOSIS — E782 Mixed hyperlipidemia: Secondary | ICD-10-CM | POA: Diagnosis not present

## 2022-09-22 LAB — CBC WITH DIFFERENTIAL/PLATELET
Basophils Absolute: 0.1 10*3/uL (ref 0.0–0.1)
Basophils Relative: 0.8 % (ref 0.0–3.0)
Eosinophils Absolute: 0.4 10*3/uL (ref 0.0–0.7)
Eosinophils Relative: 4.8 % (ref 0.0–5.0)
HCT: 44.6 % (ref 36.0–46.0)
Hemoglobin: 15.1 g/dL — ABNORMAL HIGH (ref 12.0–15.0)
Lymphocytes Relative: 36.6 % (ref 12.0–46.0)
Lymphs Abs: 3 10*3/uL (ref 0.7–4.0)
MCHC: 33.8 g/dL (ref 30.0–36.0)
MCV: 90.6 fl (ref 78.0–100.0)
Monocytes Absolute: 0.5 10*3/uL (ref 0.1–1.0)
Monocytes Relative: 6.2 % (ref 3.0–12.0)
Neutro Abs: 4.3 10*3/uL (ref 1.4–7.7)
Neutrophils Relative %: 51.6 % (ref 43.0–77.0)
Platelets: 289 10*3/uL (ref 150.0–400.0)
RBC: 4.93 Mil/uL (ref 3.87–5.11)
RDW: 13.4 % (ref 11.5–15.5)
WBC: 8.3 10*3/uL (ref 4.0–10.5)

## 2022-09-22 LAB — HEMOGLOBIN A1C: Hgb A1c MFr Bld: 5.9 % (ref 4.6–6.5)

## 2022-09-22 LAB — VITAMIN D 25 HYDROXY (VIT D DEFICIENCY, FRACTURES): VITD: 62.4 ng/mL (ref 30.00–100.00)

## 2022-09-22 LAB — TSH: TSH: 1.82 u[IU]/mL (ref 0.35–5.50)

## 2022-09-22 NOTE — Assessment & Plan Note (Signed)
Chronic Reviewed dexa She is exercising regularly Taking calcium as of lately Taking vitamin d daily She does not want to take any medication Repeat dexa in two years

## 2022-09-22 NOTE — Assessment & Plan Note (Signed)
Chronic Check lipid panel  Continue lifestyle control Regular exercise and healthy diet encouraged

## 2022-09-22 NOTE — Assessment & Plan Note (Signed)
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

## 2022-09-23 LAB — COMPREHENSIVE METABOLIC PANEL
ALT: 20 U/L (ref 0–35)
AST: 23 U/L (ref 0–37)
Albumin: 4.6 g/dL (ref 3.5–5.2)
Alkaline Phosphatase: 74 U/L (ref 39–117)
BUN: 14 mg/dL (ref 6–23)
CO2: 25 mEq/L (ref 19–32)
Calcium: 11.1 mg/dL — ABNORMAL HIGH (ref 8.4–10.5)
Chloride: 104 mEq/L (ref 96–112)
Creatinine, Ser: 0.86 mg/dL (ref 0.40–1.20)
GFR: 66.76 mL/min (ref >60.00)
Glucose, Bld: 102 mg/dL — ABNORMAL HIGH (ref 70–99)
Potassium: 4.1 mEq/L (ref 3.5–5.1)
Sodium: 143 mEq/L (ref 135–145)
Total Bilirubin: 0.3 mg/dL (ref 0.2–1.2)
Total Protein: 7.8 g/dL (ref 6.0–8.3)

## 2022-09-23 LAB — LIPID PANEL
Cholesterol: 210 mg/dL — ABNORMAL HIGH (ref 0–200)
HDL: 48.7 mg/dL (ref >39.00)
NonHDL: 161.74
Total CHOL/HDL Ratio: 4
Triglycerides: 289 mg/dL — ABNORMAL HIGH (ref 0.0–149.0)
VLDL: 57.8 mg/dL — ABNORMAL HIGH (ref 0.0–40.0)

## 2022-09-23 LAB — LDL CHOLESTEROL, DIRECT: Direct LDL: 108 mg/dL

## 2022-09-25 ENCOUNTER — Encounter: Payer: Self-pay | Admitting: Internal Medicine

## 2022-09-25 DIAGNOSIS — R7303 Prediabetes: Secondary | ICD-10-CM | POA: Insufficient documentation

## 2022-09-30 ENCOUNTER — Encounter: Payer: Self-pay | Admitting: Internal Medicine

## 2022-10-06 ENCOUNTER — Encounter: Payer: Self-pay | Admitting: Internal Medicine

## 2022-10-06 NOTE — Progress Notes (Signed)
Outside notes received. Information abstracted. Notes sent to scan.  

## 2022-10-07 ENCOUNTER — Telehealth: Payer: Self-pay | Admitting: Internal Medicine

## 2022-10-07 ENCOUNTER — Encounter: Payer: Self-pay | Admitting: Internal Medicine

## 2022-10-07 ENCOUNTER — Other Ambulatory Visit: Payer: Self-pay | Admitting: Internal Medicine

## 2022-10-07 DIAGNOSIS — L719 Rosacea, unspecified: Secondary | ICD-10-CM

## 2022-10-07 MED ORDER — METRONIDAZOLE 0.75 % EX CREA: TOPICAL_CREAM | CUTANEOUS | 5 refills | Status: AC

## 2022-10-07 NOTE — Progress Notes (Signed)
Outside notes received. Information abstracted. Notes sent to scan.  

## 2022-10-07 NOTE — Telephone Encounter (Signed)
Prescription Request  10/07/2022  LOV: 09/22/2022  What is the name of the medication or equipment? Metrocream 0.75% - generic  Have you contacted your pharmacy to request a refill? No   Which pharmacy would you like this sent to?  [facility] [place] [place] [address] Phone: [phone] Fax: [phone]    Patient notified that their request is being sent to the clinical staff for review and that they should receive a response within 2 business days.   Please advise at Mobile [phone] (mobile)

## 2022-11-01 ENCOUNTER — Telehealth: Payer: Self-pay | Admitting: Internal Medicine

## 2022-11-01 NOTE — Telephone Encounter (Signed)
Patient would like a call back to go over most recent lab work. She would like a call back at [phone].

## 2022-11-02 NOTE — Telephone Encounter (Signed)
Left message for patient. If she returns call please let her know letter with results was mailed out to her on 10/12/22. Results listed below:  Kidney function, liver tests, blood counts, thyroid function normal.  Vitamin d level is good.  Sugars just above normal on average and in the prediabetic range.  LDL or bad cholesterol is good, but triglycerides are elevated.

## 2022-11-04 ENCOUNTER — Encounter: Payer: Self-pay | Admitting: Internal Medicine

## 2022-11-09 DIAGNOSIS — H2513 Age-related nuclear cataract, bilateral: Secondary | ICD-10-CM | POA: Diagnosis not present

## 2022-11-09 DIAGNOSIS — H02834 Dermatochalasis of left upper eyelid: Secondary | ICD-10-CM | POA: Diagnosis not present

## 2022-11-09 DIAGNOSIS — H02832 Dermatochalasis of right lower eyelid: Secondary | ICD-10-CM | POA: Diagnosis not present

## 2022-12-10 ENCOUNTER — Telehealth: Payer: Self-pay | Admitting: Internal Medicine

## 2022-12-10 MED ORDER — PREMARIN 0.625 MG/GM VA CREA: 1.0000 | TOPICAL_CREAM | VAGINAL | 12 refills | Status: DC

## 2022-12-10 NOTE — Telephone Encounter (Signed)
Patient would like a script for an estrogen cream to insert vaginally.  Pharmacy:  [facility]  on [address]  Please call patient and let her know.  [phone]

## 2022-12-10 NOTE — Telephone Encounter (Signed)
Prescription sent

## 2022-12-13 NOTE — Telephone Encounter (Addendum)
  conjugated estrogens (PREMARIN) vaginal cream   Pt is asking that estrace be sent in to her pharmacy as the above cream is made from Horse Urine (PREgnant MARes' urINe). So, she wants the Estradiol vaginal gel sent in to [facility] on [address] being that this is more of a genetic based.

## 2022-12-14 ENCOUNTER — Other Ambulatory Visit: Payer: Self-pay

## 2022-12-14 MED ORDER — ESTRADIOL 0.1 MG/GM VA CREA: 1.0000 | TOPICAL_CREAM | Freq: Every day | VAGINAL | 12 refills | Status: DC

## 2022-12-14 NOTE — Telephone Encounter (Signed)
Patient called back today concerning this.  She wants to know when it will be called in.

## 2022-12-14 NOTE — Telephone Encounter (Signed)
Vaginal cream sent in

## 2022-12-30 MED ORDER — ESTRADIOL 0.1 MG/GM VA CREA: 4.0000 g | TOPICAL_CREAM | VAGINAL | 12 refills | Status: DC

## 2022-12-30 NOTE — Addendum Note (Signed)
Addended by: Pincus Sanes on: 12/30/2022 03:33 PM   Modules accepted: Orders

## 2022-12-30 NOTE — Telephone Encounter (Signed)
Updated rx sent

## 2022-12-30 NOTE — Telephone Encounter (Signed)
[facility] had called asking how much cream pt is to use each applicator the doses can go from 1-4 grams and if pt is to use nightly or every two weeks like the (PREMARIN) .  Please call pharmacist back at [phone]

## 2023-10-04 DIAGNOSIS — Z1331 Encounter for screening for depression: Secondary | ICD-10-CM | POA: Diagnosis not present

## 2023-10-04 DIAGNOSIS — Z01411 Encounter for gynecological examination (general) (routine) with abnormal findings: Secondary | ICD-10-CM | POA: Diagnosis not present

## 2023-11-14 DIAGNOSIS — D1801 Hemangioma of skin and subcutaneous tissue: Secondary | ICD-10-CM | POA: Diagnosis not present

## 2023-11-14 DIAGNOSIS — D2272 Melanocytic nevi of left lower limb, including hip: Secondary | ICD-10-CM | POA: Diagnosis not present

## 2023-11-14 DIAGNOSIS — L738 Other specified follicular disorders: Secondary | ICD-10-CM | POA: Diagnosis not present

## 2023-11-14 DIAGNOSIS — L821 Other seborrheic keratosis: Secondary | ICD-10-CM | POA: Diagnosis not present

## 2023-11-14 DIAGNOSIS — L814 Other melanin hyperpigmentation: Secondary | ICD-10-CM | POA: Diagnosis not present

## 2023-11-14 DIAGNOSIS — D2261 Melanocytic nevi of right upper limb, including shoulder: Secondary | ICD-10-CM | POA: Diagnosis not present

## 2023-11-14 DIAGNOSIS — L918 Other hypertrophic disorders of the skin: Secondary | ICD-10-CM | POA: Diagnosis not present

## 2023-11-14 DIAGNOSIS — L718 Other rosacea: Secondary | ICD-10-CM | POA: Diagnosis not present

## 2024-01-03 ENCOUNTER — Other Ambulatory Visit: Payer: Self-pay | Admitting: Internal Medicine

## 2024-01-06 DIAGNOSIS — Z1231 Encounter for screening mammogram for malignant neoplasm of breast: Secondary | ICD-10-CM | POA: Diagnosis not present

## 2024-01-06 LAB — HM MAMMOGRAPHY

## 2024-04-11 ENCOUNTER — Encounter: Admitting: Internal Medicine

## 2024-07-01 ENCOUNTER — Encounter: Payer: Self-pay | Admitting: Internal Medicine

## 2024-07-01 NOTE — Patient Instructions (Addendum)

## 2024-07-01 NOTE — Progress Notes (Unsigned)
 Subjective:    Patient ID: Duwaine Claude, female    DOB: 11/27/1950, 73 y.o.   MRN: [id number]      HPI Annlouise is here for a Physical exam and her chronic medical problems.    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  She did not see anyone for this because she did not want to take prednisone.  She did heat and natural remedies.SABRA  it has gone away.    She is exercising regularly and taking a lot of supplements.   Medications and allergies reviewed with patient and updated if appropriate.  Current Outpatient Medications on File Prior to Visit  Medication Sig Dispense Refill   B Complex-Biotin-FA (B COMPLETE PO) Take by mouth daily.     Cholecalciferol (VITAMIN D -3) 1000 units CAPS Take 5,000 Units by mouth daily.      COD LIVER OIL PO Take by mouth.     estradiol  (ESTRACE ) 0.1 MG/GM vaginal cream PLACE 4 GRAMS VAGINALLY 2 TIMES A WEEK 42.5 g 5   Magnesium  200 MG TABS Take by mouth daily.     MELATONIN PO Take 3 mg by mouth at bedtime.     metroNIDAZOLE  (METROCREAM ) 0.75 % cream APPLY EXTERNALLY TO THE AFFECTED AREA TWICE DAILY 45 g 5   TURMERIC PO Take by mouth daily.     UNABLE TO FIND daily. Alive Calcium , K2, Mag, D3, Buron, & Strontion     ZINC SULFATE PO Take by mouth.     No current facility-administered medications on file prior to visit.    Review of Systems  Constitutional:  Negative for fever.  HENT:  Positive for rhinorrhea.   Eyes:  Negative for visual disturbance.  Respiratory:  Positive for cough. Negative for shortness of breath and wheezing.   Cardiovascular:  Negative for chest pain, palpitations and leg swelling.  Gastrointestinal:  Negative for abdominal pain, blood in stool, constipation and diarrhea.       No gerd  Genitourinary:  Negative for dysuria.  Musculoskeletal:  Positive for arthralgias (mild - different places) and back pain (occ lower back).  Neurological:  Positive for dizziness. Negative for  light-headedness and headaches.  Psychiatric/Behavioral:  Negative for dysphoric mood. The patient is not nervous/anxious.        Objective:   Vitals:   07/04/24 1430  BP: 116/72  Pulse: 78  Temp: 98 F (36.7 C)  SpO2: 98%   Filed Weights   07/04/24 1430  Weight: 138 lb (62.6 kg)   Body mass index is 22.27 kg/m.  BP Readings from Last 3 Encounters:  07/04/24 116/72  09/22/22 128/80  04/13/21 116/74    Wt Readings from Last 3 Encounters:  07/04/24 138 lb (62.6 kg)  09/22/22 144 lb (65.3 kg)  04/13/21 143 lb 3.2 oz (65 kg)       Physical Exam Constitutional: She appears well-developed and well-nourished. No distress.  HENT:  Head: Normocephalic and atraumatic.  Right Ear: External ear normal. Normal ear canal and TM Left Ear: External ear normal.  Normal ear canal and TM Mouth/Throat: Oropharynx is clear and moist.  Eyes: Conjunctivae normal.  Neck: Neck supple. No tracheal deviation present. No thyromegaly present.  No carotid bruit  Cardiovascular: Normal rate, regular rhythm and normal heart sounds.   No murmur heard.  No edema. Pulmonary/Chest: Effort normal and breath sounds normal. No respiratory distress. She has no wheezes. She has no rales.  Breast: deferred  Abdominal: Soft. She exhibits no distension. There is no tenderness.  Lymphadenopathy: She has no cervical adenopathy.  Skin: Skin is warm and dry. She is not diaphoretic.  Psychiatric: She has a normal mood and affect. Her behavior is normal.     Lab Results  Component Value Date   WBC 8.3 09/22/2022   HGB 15.1 (H) 09/22/2022   HCT 44.6 09/22/2022   PLT 289.0 09/22/2022   GLUCOSE 102 (H) 09/22/2022   CHOL 210 (H) 09/22/2022   TRIG 289.0 (H) 09/22/2022   HDL 48.70 09/22/2022   LDLDIRECT 108.0 09/22/2022   LDLCALC 130 (H) 04/13/2021   ALT 20 09/22/2022   AST 23 09/22/2022   NA 143 09/22/2022   K 4.1 09/22/2022   CL 104 09/22/2022   CREATININE 0.86 09/22/2022   BUN 14 09/22/2022    CO2 25 09/22/2022   TSH 1.82 09/22/2022   HGBA1C 5.9 09/22/2022    The 10-year ASCVD risk score (Arnett DK, et al., 2019) is: 13.5%   Values used to calculate the score:     Age: 47 years     Clincally relevant sex: Female     Is Non-Hispanic African American: No     Diabetic: No     Tobacco smoker: No     Systolic Blood Pressure: 116 mmHg     Is BP treated: No     HDL Cholesterol: 48.7 mg/dL     Total Cholesterol: 210 mg/dL      Assessment & Plan:   Physical exam: Screening blood work  ordered Exercise  regular  Weight  is normal Substance abuse  none   Reviewed recommended immunizations.   Health Maintenance  Topic Date Due   Colonoscopy  03/13/2024   COVID-19 Vaccine (1 - 2025-26 season) 07/17/2024 (Originally 04/02/2024)   Medicare Annual Wellness (AWV)  08/04/2024 (Originally 10/25/2018)   Zoster Vaccines- Shingrix (1 of 2) 10/02/2024 (Originally 10/03/1998)   Influenza Vaccine  10/30/2024 (Originally 03/02/2024)   Pneumococcal Vaccine: 50+ Years (1 of 1 - PCV) 07/04/2025 (Originally 10/03/1998)   Bone Density Scan  09/08/2024   Hepatitis C Screening  Completed   Meningococcal B Vaccine  Aged Out   DTaP/Tdap/Td  Discontinued   Mammogram  Discontinued      Will schedule colonoscopy  Deferred all vaccines    See Problem List for Assessment and Plan of chronic medical problems.

## 2024-07-04 ENCOUNTER — Ambulatory Visit: Admitting: Internal Medicine

## 2024-07-04 VITALS — BP 116/72 | HR 78 | Temp 98.0°F | Ht 66.0 in | Wt 138.0 lb

## 2024-07-04 DIAGNOSIS — E781 Pure hyperglyceridemia: Secondary | ICD-10-CM | POA: Diagnosis not present

## 2024-07-04 DIAGNOSIS — L719 Rosacea, unspecified: Secondary | ICD-10-CM | POA: Diagnosis not present

## 2024-07-04 DIAGNOSIS — Z0000 Encounter for general adult medical examination without abnormal findings: Secondary | ICD-10-CM | POA: Diagnosis not present

## 2024-07-04 DIAGNOSIS — M810 Age-related osteoporosis without current pathological fracture: Secondary | ICD-10-CM

## 2024-07-04 DIAGNOSIS — R7303 Prediabetes: Secondary | ICD-10-CM

## 2024-07-04 DIAGNOSIS — E782 Mixed hyperlipidemia: Secondary | ICD-10-CM

## 2024-07-04 NOTE — Assessment & Plan Note (Signed)
 Chronic Continue metronidazole  cream  0.75%

## 2024-07-04 NOTE — Assessment & Plan Note (Addendum)
 Chronic Check lipid panel, CMP, CBC, TSH Continue lifestyle control Regular exercise and healthy diet

## 2024-07-04 NOTE — Assessment & Plan Note (Addendum)
 Chronic DEXA up-to-date She is exercising regularly Continue calcium  and vitamin D  supplementation She has deferred medication in the past DEXA due next year -ordered

## 2024-07-04 NOTE — Assessment & Plan Note (Signed)
 Chronic Lab Results  Component Value Date   HGBA1C 5.9 09/22/2022   Check a1c Low sugar / carb diet Stressed regular exercise

## 2024-07-11 ENCOUNTER — Other Ambulatory Visit (INDEPENDENT_AMBULATORY_CARE_PROVIDER_SITE_OTHER)

## 2024-07-11 ENCOUNTER — Ambulatory Visit: Payer: Self-pay | Admitting: Internal Medicine

## 2024-07-11 ENCOUNTER — Telehealth: Payer: Self-pay | Admitting: Internal Medicine

## 2024-07-11 DIAGNOSIS — R7303 Prediabetes: Secondary | ICD-10-CM | POA: Diagnosis not present

## 2024-07-11 DIAGNOSIS — M810 Age-related osteoporosis without current pathological fracture: Secondary | ICD-10-CM | POA: Diagnosis not present

## 2024-07-11 DIAGNOSIS — E781 Pure hyperglyceridemia: Secondary | ICD-10-CM

## 2024-07-11 LAB — LIPID PANEL
Cholesterol: 192 mg/dL (ref 0–200)
HDL: 43.9 mg/dL (ref >39.00)
LDL Cholesterol: 128 mg/dL — ABNORMAL HIGH (ref 0–99)
NonHDL: 147.8
Total CHOL/HDL Ratio: 4
Triglycerides: 101 mg/dL (ref 0.0–149.0)
VLDL: 20.2 mg/dL (ref 0.0–40.0)

## 2024-07-11 LAB — COMPREHENSIVE METABOLIC PANEL WITH GFR
ALT: 17 U/L (ref 0–35)
AST: 20 U/L (ref 0–37)
Albumin: 4.4 g/dL (ref 3.5–5.2)
Alkaline Phosphatase: 85 U/L (ref 39–117)
BUN: 18 mg/dL (ref 6–23)
CO2: 28 meq/L (ref 19–32)
Calcium: 9.7 mg/dL (ref 8.4–10.5)
Chloride: 104 meq/L (ref 96–112)
Creatinine, Ser: 0.75 mg/dL (ref 0.40–1.20)
GFR: 77.69 mL/min (ref >60.00)
Glucose, Bld: 87 mg/dL (ref 70–99)
Potassium: 3.8 meq/L (ref 3.5–5.1)
Sodium: 140 meq/L (ref 135–145)
Total Bilirubin: 0.4 mg/dL (ref 0.2–1.2)
Total Protein: 7.4 g/dL (ref 6.0–8.3)

## 2024-07-11 LAB — CBC
HCT: 42.8 % (ref 36.0–46.0)
Hemoglobin: 14.3 g/dL (ref 12.0–15.0)
MCHC: 33.5 g/dL (ref 30.0–36.0)
MCV: 89.9 fl (ref 78.0–100.0)
Platelets: 245 K/uL (ref 150.0–400.0)
RBC: 4.76 Mil/uL (ref 3.87–5.11)
RDW: 13.5 % (ref 11.5–15.5)
WBC: 6.2 K/uL (ref 4.0–10.5)

## 2024-07-11 LAB — VITAMIN D 25 HYDROXY (VIT D DEFICIENCY, FRACTURES): VITD: 70.22 ng/mL (ref 30.00–100.00)

## 2024-07-11 LAB — HEMOGLOBIN A1C: Hgb A1c MFr Bld: 5.6 % (ref 4.6–6.5)

## 2024-07-11 LAB — TSH: TSH: 2.59 u[IU]/mL (ref 0.35–5.50)

## 2024-07-11 NOTE — Telephone Encounter (Signed)
 Patient request that you mail copy of lab work to her home.

## 2024-07-12 NOTE — Telephone Encounter (Signed)
Placed to go out in mail today

## 2024-07-16 ENCOUNTER — Telehealth: Payer: Self-pay

## 2024-07-16 NOTE — Telephone Encounter (Signed)
 Copied from CRM #[id number]. Topic: Clinical - Lab/Test Results >> Jul 16, 2024  9:54 AM Herma G wrote: Reason for CRM: Pt requested a call back at [phone] to discuss recent test results, specifically sugar levels and triglycerides.

## 2024-07-16 NOTE — Telephone Encounter (Signed)
 Attempted to reach patient but line busy x 2.

## 2024-07-24 NOTE — Telephone Encounter (Signed)
 Attempted to reach patient again today but line has been busy x 4 times I have called.  Left message on her cellphone to return call to clinic so lab questions could be addressed.  Lab copy was also mailed out to her previously.
